# Patient Record
Sex: Female | Born: 1960 | Race: Black or African American | Hispanic: No | Marital: Married | Smoking: Former smoker
Health system: Southern US, Community
[De-identification: ages and names within clinical notes are randomized; demographics above are authoritative.]

## PROBLEM LIST (undated history)

## (undated) DIAGNOSIS — B1920 Unspecified viral hepatitis C without hepatic coma: Secondary | ICD-10-CM

## (undated) HISTORY — DX: Unspecified viral hepatitis C without hepatic coma: B19.20

## (undated) HISTORY — PX: BREAST SURGERY: SHX581

---

## 2004-10-31 HISTORY — PX: BREAST BIOPSY: SHX20

## 2005-05-10 ENCOUNTER — Ambulatory Visit: Payer: Self-pay | Admitting: Obstetrics and Gynecology

## 2005-06-02 ENCOUNTER — Ambulatory Visit: Payer: Self-pay | Admitting: Surgery

## 2009-08-06 ENCOUNTER — Ambulatory Visit: Payer: Self-pay | Admitting: Obstetrics and Gynecology

## 2010-09-14 ENCOUNTER — Ambulatory Visit: Payer: Self-pay | Admitting: Obstetrics and Gynecology

## 2012-03-08 ENCOUNTER — Ambulatory Visit: Payer: Self-pay | Admitting: Obstetrics and Gynecology

## 2012-06-02 ENCOUNTER — Emergency Department: Payer: Self-pay | Admitting: *Deleted

## 2013-08-26 ENCOUNTER — Encounter (INDEPENDENT_AMBULATORY_CARE_PROVIDER_SITE_OTHER): Payer: Self-pay

## 2013-08-26 ENCOUNTER — Ambulatory Visit (INDEPENDENT_AMBULATORY_CARE_PROVIDER_SITE_OTHER): Payer: 59 | Admitting: Adult Health

## 2013-08-26 ENCOUNTER — Encounter: Payer: Self-pay | Admitting: Adult Health

## 2013-08-26 VITALS — BP 120/70 | HR 75 | Temp 98.1°F | Resp 12 | Ht 62.25 in | Wt 141.0 lb

## 2013-08-26 DIAGNOSIS — Z8639 Personal history of other endocrine, nutritional and metabolic disease: Secondary | ICD-10-CM | POA: Insufficient documentation

## 2013-08-26 DIAGNOSIS — K5900 Constipation, unspecified: Secondary | ICD-10-CM

## 2013-08-26 NOTE — Assessment & Plan Note (Signed)
Increase fluid intake. Increase fiber. May try MiraLax over-the-counter laxatives as needed.

## 2013-08-26 NOTE — Patient Instructions (Signed)
   Thank you for choosing [facility] for your health care needs.  Please schedule your yearly physical for November 2014.  Please remember to activate your MyChart account. The activation code is located at the end of this form.

## 2013-08-26 NOTE — Assessment & Plan Note (Signed)
Previously given Drisdol for 12 weeks. Has not taken any OTC vit D since. Patient has physical exam due in November. We will follow up on her vit D as well as other labs.

## 2013-08-26 NOTE — Progress Notes (Signed)
  Subjective:    Patient ID: Kimberly Madden, female    DOB: 05/10/1961, 52 y.o.   MRN: [id number]  HPI  Patient is a pleasant 52 y/o female who presents to clinic to establish care. She was previously followed by Dr. Thornell Mule in [place]. Will request medical records. Patient is feeling well overall. She reports a history of vitamin D deficiency status post taking Drisdol and also some problems with constipation. She manages her constipation by increasing her vegetable and fruit intake.    Review of Systems  Constitutional: Negative.   HENT: Negative.   Eyes: Negative.        Recent eye exam 1 month ago  Respiratory: Negative.   Cardiovascular: Negative.   Gastrointestinal: Positive for constipation. Negative for abdominal pain and diarrhea.  Endocrine: Negative.   Genitourinary: Negative.   Musculoskeletal: Negative.   Skin: Negative.   Allergic/Immunologic: Negative.   Neurological: Negative.   Psychiatric/Behavioral: Negative.        Objective:   Physical Exam  Constitutional: She is oriented to person, place, and time. She appears well-developed and well-nourished. No distress.  HENT:  Head: Normocephalic and atraumatic.  Right Ear: External ear normal.  Left Ear: External ear normal.  Nose: Nose normal.  Mouth/Throat: Oropharynx is clear and moist.  Eyes: Conjunctivae and EOM are normal. Pupils are equal, round, and reactive to light.  Neck: Normal range of motion. Neck supple.  Cardiovascular: Normal rate, regular rhythm and normal heart sounds.  Exam reveals no gallop and no friction rub.   No murmur heard. Pulmonary/Chest: Effort normal and breath sounds normal.  Abdominal: Soft. Bowel sounds are normal.  Musculoskeletal: Normal range of motion.  Neurological: She is alert and oriented to person, place, and time.  Skin: Skin is warm and dry.  Psychiatric: She has a normal mood and affect. Her behavior is normal. Judgment and thought content normal.    BP  120/70  Pulse 75  Temp(Src) 98.1 F (36.7 C) (Oral)  Resp 12  Ht 5' 2.25" (1.581 m)  Wt 141 lb (63.957 kg)  BMI 25.59 kg/m2  SpO2 98%       Assessment & Plan:

## 2014-06-05 ENCOUNTER — Encounter: Payer: 59 | Admitting: Adult Health

## 2014-06-12 ENCOUNTER — Ambulatory Visit (INDEPENDENT_AMBULATORY_CARE_PROVIDER_SITE_OTHER): Payer: 59 | Admitting: Adult Health

## 2014-06-12 ENCOUNTER — Encounter: Payer: Self-pay | Admitting: Adult Health

## 2014-06-12 VITALS — BP 133/79 | HR 66 | Temp 98.2°F | Resp 14 | Ht 62.25 in | Wt 146.2 lb

## 2014-06-12 DIAGNOSIS — Z1239 Encounter for other screening for malignant neoplasm of breast: Secondary | ICD-10-CM

## 2014-06-12 DIAGNOSIS — Z0000 Encounter for general adult medical examination without abnormal findings: Secondary | ICD-10-CM

## 2014-06-12 DIAGNOSIS — Z1211 Encounter for screening for malignant neoplasm of colon: Secondary | ICD-10-CM

## 2014-06-12 DIAGNOSIS — Z23 Encounter for immunization: Secondary | ICD-10-CM

## 2014-06-12 NOTE — Progress Notes (Signed)
Patient ID: Kimberly Madden, female   DOB: November 13, 1960, 53 y.o.   MRN: [id number]   Subjective:    Patient ID: Kimberly Madden, female    DOB: November 13, 1960, 53 y.o.   MRN: [id number]  HPI Pt is a pleasant 53 y/o female who presents for her annual physical exam and Health Maintenance update. She is feeling well. No concerns this visit.  No past medical history on file.   Past Surgical History  Procedure Laterality Date  . Breast surgery Left     benign biopsy     Family History  Problem Relation Age of Onset  . Cancer Paternal Aunt 650    breast cancer - died  . Cancer Paternal Aunt 4060    lung cancer - smoker - died  . Diabetes Paternal Aunt      History   Social History  . Marital Status: Married    Spouse Name: Smitty CordsBruce    Number of Children: 1  . Years of Education: 12   Occupational History  . Customer Service [employer]   Social History Main Topics  . Smoking status: Former Smoker -- 3 years    Types: Cigarettes    Quit date: 08/01/1983  . Smokeless tobacco: Never Used  . Alcohol Use: Yes  . Drug Use: No  . Sexual Activity: Not on file   Other Topics Concern  . Not on file   Social History Narrative   Kimberly Madden grew up in [place], [place]. She lives at home with her husband, Smitty CordsBruce, of 20 years. Kimberly Madden has a daughter, Drema PryShayla, who lives in [place]. Kimberly Madden and her husband have a dog named Will. She enjoys traveling and gardening. She has a vegetable garden that she tends to. She also enjoys spending time with friends and family. 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        Review of Systems  Constitutional: Negative.   HENT: Negative.   Eyes: Negative.   Respiratory: Negative.   Cardiovascular: Negative.   Gastrointestinal: Negative.   Endocrine: Negative.   Genitourinary: Negative.   Musculoskeletal: Negative.   Skin: Negative.   Allergic/Immunologic: Negative.   Neurological: Negative.   Hematological: Negative.     Psychiatric/Behavioral: Negative.        Objective:  BP 133/79  Pulse 66  Temp(Src) 98.2 F (36.8 C) (Oral)  Resp 14  Ht 5' 2.25" (1.581 m)  Wt 146 lb 4 oz (66.339 kg)  BMI 26.54 kg/m2  SpO2 100%   Physical Exam  Constitutional: She is oriented to person, place, and time. She appears well-developed and well-nourished. No distress.  HENT:  Head: Normocephalic and atraumatic.  Right Ear: External ear normal.  Left Ear: External ear normal.  Nose: Nose normal.  Mouth/Throat: Oropharynx is clear and moist.  Eyes: Conjunctivae and EOM are normal. Pupils are equal, round, and reactive to light.  Neck: Normal range of motion. Neck supple. No tracheal deviation present. No thyromegaly present.  Cardiovascular: Normal rate, regular rhythm, normal heart sounds and intact distal pulses.  Exam reveals no gallop and no friction rub.   No murmur heard. Pulmonary/Chest: Effort normal and breath sounds normal. No respiratory distress. She has no wheezes. She has no rales.  Abdominal: Soft. Bowel sounds are normal. She exhibits no distension and no mass. There is no tenderness. There is no rebound and no guarding.  Musculoskeletal: Normal range of motion. She exhibits no edema and no tenderness.  Lymphadenopathy:    She has no cervical adenopathy.  Neurological: She is alert and oriented to person, place, and time. She has normal reflexes. No cranial nerve deficit. Coordination normal.  Skin: Skin is warm and dry.  Psychiatric: She has a normal mood and affect. Her behavior is normal. Judgment and thought content normal.       Assessment & Plan:   1. Routine general medical examination at a health care facility Normal physical exam. Breast exam was normal. Screenings addressed separately  2. Screening for breast cancer Mammogram will be scheduled prior to leaving the office today - MM DIGITAL SCREENING BILATERAL; Future  3. Screen for colon cancer Refer to [facility] GI for her screening  colonoscopy - Ambulatory referral to Gastroenterology  4. Need for Tdap vaccination Given in clinic today.

## 2014-06-12 NOTE — Patient Instructions (Signed)
  You had your annual physical exam today.  Please have your labs drawn at [facility] at your earliest convenience. You will need to be fasting. Please drink water  I am referring you to a screening colonoscopy  Please have your mammogram scheduled prior to leaving the office.  You received your tetanus vaccine today. This is good for 10 years.

## 2014-06-12 NOTE — Progress Notes (Signed)
Pre visit review using our clinic review tool, if applicable. No additional management support is needed unless otherwise documented below in the visit note. 

## 2014-06-13 ENCOUNTER — Encounter: Payer: Self-pay | Admitting: Internal Medicine

## 2014-07-11 LAB — LIPID PANEL
Cholesterol: 175 mg/dL (ref 0–200)
HDL: 71 mg/dL — AB (ref 35–70)
LDL CALC: 87 mg/dL
TRIGLYCERIDES: 87 mg/dL (ref 40–160)

## 2014-07-11 LAB — BASIC METABOLIC PANEL
BUN: 12 mg/dL (ref 4–21)
Creatinine: 0.8 mg/dL (ref 0.5–1.1)
Glucose: 92 mg/dL
POTASSIUM: 4.5 mmol/L (ref 3.4–5.3)
Sodium: 140 mmol/L (ref 137–147)

## 2014-07-11 LAB — CBC AND DIFFERENTIAL
HEMATOCRIT: 38 % (ref 36–46)
HEMOGLOBIN: 13.2 g/dL (ref 12.0–16.0)
NEUTROS ABS: 2 /uL
Platelets: 208 10*3/uL (ref 150–399)
WBC: 5.5 10^3/mL

## 2014-07-11 LAB — HEPATIC FUNCTION PANEL
ALT: 26 U/L (ref 7–35)
AST: 43 U/L — AB (ref 13–35)
Alkaline Phosphatase: 55 U/L (ref 25–125)
Bilirubin, Total: 0.5 mg/dL

## 2014-07-11 LAB — TSH: TSH: 3.31 u[IU]/mL (ref 0.41–5.90)

## 2014-07-17 ENCOUNTER — Telehealth: Payer: Self-pay | Admitting: Adult Health

## 2014-07-17 ENCOUNTER — Other Ambulatory Visit: Payer: Self-pay | Admitting: Adult Health

## 2014-07-17 DIAGNOSIS — R768 Other specified abnormal immunological findings in serum: Secondary | ICD-10-CM

## 2014-07-17 NOTE — Telephone Encounter (Signed)
Labs show the following:  Normal WBC, RBC, Hgb, Platelets  Normal electrolytes, kidney function, glucose  Slightly elevated AST (liver enzyme) - need to recheck this in 3-4 weeks  Cholesterol is good  B12 is low normal - recommend taking 1000 mcg orally daily  Vitamin D is low - needs to take D3 - 2000 units daily  Antibodies for Hepatitis C was detected - this means she has had past hepatitis C infection or current infection. I am referring her to GI.

## 2014-07-18 NOTE — Telephone Encounter (Signed)
Called patient, no answer. Unable to leave voicemail. Letter was mailed with Raquel comments and lab results.

## 2014-07-24 ENCOUNTER — Telehealth: Payer: Self-pay | Admitting: Internal Medicine

## 2014-07-24 NOTE — Telephone Encounter (Signed)
Recent labs showed normal blood counts, normal kidney function. Slight elevation of liver function tests noted. Cholesterol was normal. B12 was low normal. TSH was normal. Testing for Hepatitis C was positive.

## 2014-08-06 ENCOUNTER — Ambulatory Visit (AMBULATORY_SURGERY_CENTER): Payer: Self-pay

## 2014-08-06 VITALS — Ht 64.0 in | Wt 145.0 lb

## 2014-08-06 DIAGNOSIS — Z1211 Encounter for screening for malignant neoplasm of colon: Secondary | ICD-10-CM

## 2014-08-06 MED ORDER — MOVIPREP 100 G PO SOLR: 1.0000 | Freq: Once | ORAL | Status: DC

## 2014-08-06 NOTE — Progress Notes (Signed)
No allergies to eggs or soy No past problems with anesthesia No home oxygen No diet/weight loss meds  Has emai  Emmi instructions given for colonoscopy 

## 2014-08-20 ENCOUNTER — Ambulatory Visit (AMBULATORY_SURGERY_CENTER): Payer: 59 | Admitting: Internal Medicine

## 2014-08-20 ENCOUNTER — Encounter: Payer: Self-pay | Admitting: Internal Medicine

## 2014-08-20 VITALS — BP 141/79 | HR 58 | Temp 96.4°F | Resp 21 | Ht 64.0 in | Wt 145.0 lb

## 2014-08-20 DIAGNOSIS — Z1211 Encounter for screening for malignant neoplasm of colon: Secondary | ICD-10-CM

## 2014-08-20 MED ORDER — SODIUM CHLORIDE 0.9 % IV SOLN: 500.0000 mL | INTRAVENOUS | Status: DC

## 2014-08-20 NOTE — Progress Notes (Signed)
Stable to RR 

## 2014-08-20 NOTE — Op Note (Signed)
[facility] [address] [place], [id number]   COLONOSCOPY PROCEDURE REPORT  PATIENT: Kimberly Madden, [name]  MR#: [id number] BIRTHDATE: 11/08/1960 , 53  yrs. old GENDER: female ENDOSCOPIST: Beverley FiedlerJay M Heru Montz, MD REFERRED BY: Dale Durhamharlene Scott, MD PROCEDURE DATE:  08/20/2014 PROCEDURE:   Colonoscopy, screening First Screening Colonoscopy - Avg.  risk and is 50 yrs.  old or older Yes.  Prior Negative Screening - Now for repeat screening. N/A  History of Adenoma - Now for follow-up colonoscopy & has been > or = to 3 yrs.  N/A  Polyps Removed Today? No.  Polyps Removed Today? No.  Recommend repeat exam, <10 yrs? Polyps Removed Today? No.  Recommend repeat exam, <10 yrs? No. ASA CLASS:   Class I INDICATIONS:average risk for colorectal cancer and first colonoscopy. MEDICATIONS: Monitored anesthesia care and Propofol 200 mg IV; lidocaine 40 mg IV  DESCRIPTION OF PROCEDURE:   After the risks benefits and alternatives of the procedure were thoroughly explained, informed consent was obtained.  The digital rectal exam revealed no abnormalities of the rectum.   The LB PFC-H190 [id number]  endoscope was introduced through the anus and advanced to the cecum, which was identified by both the appendix and ileocecal valve. No adverse events experienced.   The quality of the prep was good, using MoviPrep  The instrument was then slowly withdrawn as the colon was fully examined.      COLON FINDINGS: A normal appearing cecum, ileocecal valve, and appendiceal orifice were identified.  The ascending, transverse, descending, sigmoid colon, and rectum appeared unremarkable. Retroflexed views revealed no abnormalities. The time to cecum=1 minutes 10 seconds.  Withdrawal time=6 minutes 34 seconds.  The scope was withdrawn and the procedure completed.  COMPLICATIONS: There were no immediate complications.  ENDOSCOPIC IMPRESSION: Normal colonoscopy  RECOMMENDATIONS: You should continue to  follow colorectal cancer screening guidelines for "routine risk" patients with a repeat colonoscopy in 10 years. There is no need for FOBT (stool) testing for at least 5 years.  eSigned:  Beverley FiedlerJay M Senya Hinzman, MD 08/20/2014 9:43 AM   cc:  The Patient, Dale Durhamharlene Scott, MD

## 2014-08-20 NOTE — Patient Instructions (Signed)
YOU HAD AN ENDOSCOPIC PROCEDURE TODAY AT THE [facility]: Refer to the procedure report that was given to you for any specific questions about what was found during the examination.  If the procedure report does not answer your questions, please call your gastroenterologist to clarify.  If you requested that your care partner not be given the details of your procedure findings, then the procedure report has been included in a sealed envelope for you to review at your convenience later.  YOU SHOULD EXPECT: Some feelings of bloating in the abdomen. Passage of more gas than usual.  Walking can help get rid of the air that was put into your GI tract during the procedure and reduce the bloating. If you had a lower endoscopy (such as a colonoscopy or flexible sigmoidoscopy) you may notice spotting of blood in your stool or on the toilet paper. If you underwent a bowel prep for your procedure, then you may not have a normal bowel movement for a few days.  DIET: Your first meal following the procedure should be a light meal and then it is ok to progress to your normal diet.  A half-sandwich or bowl of soup is an example of a good first meal.  Heavy or fried foods are harder to digest and may make you feel nauseous or bloated.  Likewise meals heavy in dairy and vegetables can cause extra gas to form and this can also increase the bloating.  Drink plenty of fluids but you should avoid alcoholic beverages for 24 hours.  ACTIVITY: Your care partner should take you home directly after the procedure.  You should plan to take it easy, moving slowly for the rest of the day.  You can resume normal activity the day after the procedure however you should NOT DRIVE or use heavy machinery for 24 hours (because of the sedation medicines used during the test).    SYMPTOMS TO REPORT IMMEDIATELY: A gastroenterologist can be reached at any hour.  During normal business hours, 8:30 AM to 5:00 PM Monday through Friday,  call [phone].  After hours and on weekends, please call the GI answering service at [phone] who will take a message and have the physician on call contact you.   Following lower endoscopy (colonoscopy or flexible sigmoidoscopy):  Excessive amounts of blood in the stool  Significant tenderness or worsening of abdominal pains  Swelling of the abdomen that is new, acute  Fever of 100F or higher    FOLLOW UP: If any biopsies were taken you will be contacted by phone or by letter within the next 1-3 weeks.  Call your gastroenterologist if you have not heard about the biopsies in 3 weeks.  Our staff will call the home number listed on your records the next business day following your procedure to check on you and address any questions or concerns that you may have at that time regarding the information given to you following your procedure. This is a courtesy call and so if there is no answer at the home number and we have not heard from you through the emergency physician on call, we will assume that you have returned to your regular daily activities without incident.  SIGNATURES/CONFIDENTIALITY: You and/or your care partner have signed paperwork which will be entered into your electronic medical record.  These signatures attest to the fact that that the information above on your After Visit Summary has been reviewed and is understood.  Full responsibility of the confidentiality   of this discharge information lies with you and/or your care-partner.     

## 2014-08-21 ENCOUNTER — Telehealth: Payer: Self-pay

## 2014-08-21 NOTE — Telephone Encounter (Signed)
Left a message at [phone] for the pt to call if any questions or concerns. maw

## 2014-11-12 ENCOUNTER — Ambulatory Visit: Payer: Self-pay | Admitting: Gastroenterology

## 2015-06-15 ENCOUNTER — Ambulatory Visit: Payer: 59 | Admitting: Internal Medicine

## 2015-06-15 ENCOUNTER — Other Ambulatory Visit (HOSPITAL_COMMUNITY)
Admission: RE | Admit: 2015-06-15 | Discharge: 2015-06-15 | Disposition: A | Discharge status: Home / Self Care | Payer: 59 | Source: Ambulatory Visit | Attending: Family Medicine | Admitting: Family Medicine

## 2015-06-15 ENCOUNTER — Ambulatory Visit (INDEPENDENT_AMBULATORY_CARE_PROVIDER_SITE_OTHER): Payer: 59 | Admitting: Family Medicine

## 2015-06-15 ENCOUNTER — Encounter: Payer: Self-pay | Admitting: Family Medicine

## 2015-06-15 VITALS — BP 130/82 | HR 67 | Temp 98.9°F | Ht 62.25 in | Wt 148.0 lb

## 2015-06-15 DIAGNOSIS — Z1239 Encounter for other screening for malignant neoplasm of breast: Secondary | ICD-10-CM

## 2015-06-15 DIAGNOSIS — Z124 Encounter for screening for malignant neoplasm of cervix: Secondary | ICD-10-CM

## 2015-06-15 DIAGNOSIS — B182 Chronic viral hepatitis C: Secondary | ICD-10-CM | POA: Diagnosis not present

## 2015-06-15 DIAGNOSIS — Z1151 Encounter for screening for human papillomavirus (HPV): Secondary | ICD-10-CM | POA: Insufficient documentation

## 2015-06-15 DIAGNOSIS — Z01419 Encounter for gynecological examination (general) (routine) without abnormal findings: Secondary | ICD-10-CM | POA: Insufficient documentation

## 2015-06-15 DIAGNOSIS — Z0000 Encounter for general adult medical examination without abnormal findings: Secondary | ICD-10-CM | POA: Diagnosis not present

## 2015-06-15 DIAGNOSIS — B1920 Unspecified viral hepatitis C without hepatic coma: Secondary | ICD-10-CM | POA: Insufficient documentation

## 2015-06-15 NOTE — Patient Instructions (Signed)
It was nice to see you today.  Please fax [place] your lab results.   Follow up annually or sooner if needed.  Take care  Dr. Adriana Simas

## 2015-06-15 NOTE — Progress Notes (Signed)
Pre visit review using our clinic review tool, if applicable. No additional management support is needed unless otherwise documented below in the visit note. 

## 2015-06-15 NOTE — Assessment & Plan Note (Signed)
Pap smear performed today. Gynecological and breast exams normal. Order placed for mammogram. Remainder of preventative healthcare up to date. Patient to follow-up annually.

## 2015-06-15 NOTE — Progress Notes (Signed)
Subjective:  Patient ID: Kimberly Madden, female    DOB: 04/03/1961  Age: 54 y.o. MRN: [id number]  CC: Annual physical exam with pap smear.   HPI 54 year old female presents to clinic today for an annual visit. Patient has no concerns today. Complete review of systems was done (see below).  Preventative health care Preventative Healthcare  Pap smear: In need of. Last pap smear was ~ 3 years ago.  Mammogram/Breast exam: Last breast exam and mammogram was ~ 3 years ago.  Immunizations: UTD.  Labs: Obtained recently at [facility]. Asked patient to fax to our office for our records.  Smoking/tobacco use: Former smoker.   HIV testing: Patient declined HIV testing.  PMH, Surgical Hx, Family Hx, Social History reviewed and updated as below.  Past Medical History  Diagnosis Date  . Hepatitis C    Past Surgical History  Procedure Laterality Date  . Breast surgery Left     benign biopsy    Family History  Problem Relation Age of Onset  . Cancer Paternal Aunt 56    breast cancer - died  . Cancer Paternal Aunt 22    lung cancer - smoker - died  . Diabetes Paternal Aunt   . Colon cancer Neg Hx   . Stomach cancer Neg Hx     Social History  Substance Use Topics  . Smoking status: Former Smoker -- 3 years    Types: Cigarettes    Quit date: 08/01/1983  . Smokeless tobacco: Never Used  . Alcohol Use: Yes     Review of Systems  Constitutional: Negative for fever and chills.  HENT: Negative for congestion and sore throat.   Eyes: Negative for pain and visual disturbance.  Respiratory: Negative for cough and shortness of breath.   Cardiovascular: Negative for chest pain.  Gastrointestinal: Negative for nausea, vomiting, abdominal pain, diarrhea and constipation.  Genitourinary: Negative for dysuria, urgency and frequency.  Musculoskeletal: Negative for arthralgias.  Skin: Negative for rash.  Neurological: Negative for dizziness and light-headedness.    Objective:  BP  130/82 mmHg  Pulse 67  Temp(Src) 98.9 F (37.2 C) (Oral)  Ht 5' 2.25" (1.581 m)  Wt 148 lb (67.132 kg)  BMI 26.86 kg/m2  SpO2 98%  LMP 11/06/2012  BP/Weight 06/15/2015 08/20/2014 08/06/2014  Systolic BP 130 141 -  Diastolic BP 82 79 -  Wt. (Lbs) 148 145 145  BMI 26.86 24.88 24.88   Physical Exam  Constitutional: She is oriented to person, place, and time. She appears well-developed and well-nourished. No distress.  HENT:  Head: Normocephalic and atraumatic.  Nose: Nose normal.  Mouth/Throat: Oropharynx is clear and moist. No oropharyngeal exudate.  Normal TM's bilaterally.   Eyes: Conjunctivae are normal. No scleral icterus.  Neck: Neck supple.  Cardiovascular: Normal rate and regular rhythm.   No murmur heard. Pulmonary/Chest: Effort normal and breath sounds normal. She has no wheezes. She has no rales.  Breasts: breasts appear normal, no appreciable masses, no skin or nipple changes or axillary nodes.   Abdominal: Soft. She exhibits no distension. There is no tenderness. There is no rebound and no guarding.  Genitourinary:  Pelvic Exam:        External: normal female genitalia without lesions or           masses        Vagina: normal without lesions or masses        Cervix: normal without lesions or masses        Pap smear:  performed    Lymphadenopathy:    She has no cervical adenopathy.  Neurological: She is alert and oriented to person, place, and time.  Skin: Skin is warm and dry. No rash noted.  Psychiatric: She has a normal mood and affect.  Vitals reviewed.   Assessment & Plan:   Problem List Items Addressed This Visit    Hepatitis C without hepatic coma   Preventative health care    Pap smear performed today. Gynecological and breast exams normal. Order placed for mammogram. Remainder of preventative healthcare up to date. Patient to follow-up annually.       Other Visit Diagnoses    Well woman exam with routine gynecological exam    -  Primary     Relevant Orders    Cytology - PAP    Breast cancer screening        Relevant Orders    MM DIGITAL SCREENING BILATERAL       Meds ordered this encounter  Medications  . ibuprofen (ADVIL,MOTRIN) 200 MG tablet    Sig: Take 200 mg by mouth as needed.    Follow-up: Return in about 1 year (around 06/14/2016).    Everlene Other, DO

## 2015-06-16 ENCOUNTER — Telehealth: Payer: Self-pay

## 2015-06-16 NOTE — Telephone Encounter (Signed)
Called patient to schedule her mammogram appt. i left a voicemail  To call me back.

## 2015-06-17 LAB — CYTOLOGY - PAP

## 2015-07-16 ENCOUNTER — Encounter: Payer: Self-pay | Admitting: Internal Medicine

## 2015-07-24 ENCOUNTER — Ambulatory Visit: Payer: 59

## 2015-07-24 ENCOUNTER — Ambulatory Visit
Admission: RE | Admit: 2015-07-24 | Discharge: 2015-07-24 | Disposition: A | Discharge status: Home / Self Care | Payer: 59 | Source: Ambulatory Visit | Attending: Family Medicine | Admitting: Family Medicine

## 2015-07-24 DIAGNOSIS — Z1231 Encounter for screening mammogram for malignant neoplasm of breast: Secondary | ICD-10-CM | POA: Diagnosis present

## 2015-07-24 DIAGNOSIS — Z1239 Encounter for other screening for malignant neoplasm of breast: Secondary | ICD-10-CM

## 2016-06-16 ENCOUNTER — Encounter: Payer: Self-pay | Admitting: Internal Medicine

## 2016-06-16 ENCOUNTER — Ambulatory Visit (INDEPENDENT_AMBULATORY_CARE_PROVIDER_SITE_OTHER): Payer: 59 | Admitting: Internal Medicine

## 2016-06-16 ENCOUNTER — Other Ambulatory Visit: Payer: Self-pay | Admitting: Internal Medicine

## 2016-06-16 ENCOUNTER — Encounter (INDEPENDENT_AMBULATORY_CARE_PROVIDER_SITE_OTHER): Payer: Self-pay

## 2016-06-16 VITALS — BP 160/82 | HR 71 | Ht 62.0 in | Wt 154.0 lb

## 2016-06-16 DIAGNOSIS — R030 Elevated blood-pressure reading, without diagnosis of hypertension: Secondary | ICD-10-CM | POA: Diagnosis not present

## 2016-06-16 DIAGNOSIS — B182 Chronic viral hepatitis C: Secondary | ICD-10-CM

## 2016-06-16 DIAGNOSIS — IMO0001 Reserved for inherently not codable concepts without codable children: Secondary | ICD-10-CM

## 2016-06-16 DIAGNOSIS — Z8639 Personal history of other endocrine, nutritional and metabolic disease: Secondary | ICD-10-CM

## 2016-06-16 DIAGNOSIS — Z0000 Encounter for general adult medical examination without abnormal findings: Secondary | ICD-10-CM

## 2016-06-16 NOTE — Progress Notes (Signed)
Patient ID: Carolyn Sylvia, female   DOB: 01-12-1961, 55 y.o.   MRN: [id number]   Subjective:    Patient ID: Milanna Kozlov, female    DOB: 1961/07/13, 55 y.o.   MRN: [id number]  HPI  Patient here for a physical exam.  She was previously seen by Raquel Rey.  Had physical last year with Dr Adriana Simas.  See notes.  Here today for a physical.  Was seeing GI for hepatitis C.  S/p Harvoni - 12/30/14.  States doing well.  Feels good.  Stays active.  No cardiac symptoms with increased activity or exertion.  Breathing stable.  No abdominal pain or cramping.  Bowels stable.  Elevated blood pressure today.     Past Medical History:  Diagnosis Date  . Hepatitis C    Past Surgical History:  Procedure Laterality Date  . BREAST BIOPSY Right 2006   neg  . BREAST SURGERY Left    benign biopsy   Family History  Problem Relation Age of Onset  . Cancer Paternal Aunt 52    breast cancer - died  . Cancer Paternal Aunt 24    lung cancer - smoker - died  . Diabetes Paternal Aunt   . Colon cancer Neg Hx   . Stomach cancer Neg Hx    Social History   Social History  . Marital status: Single    Spouse name: Bruce  . Number of children: 1  . Years of education: 73   Occupational History  . Customer Service [employer]   Social History Main Topics  . Smoking status: Former Smoker    Years: 3.00    Types: Cigarettes    Quit date: 08/01/1983  . Smokeless tobacco: Never Used  . Alcohol use Yes  . Drug use: No  . Sexual activity: Not Asked   Other Topics Concern  . None   Social History Narrative   Ellason grew up in [place], [place]. She lives at home with her husband, Smitty Cords, of 20 years. Sherise has a daughter, Drema Pry, who lives in [place]. Jackline and her husband have a dog named Will. She enjoys traveling and gardening. She has a vegetable garden that she tends to. She also enjoys spending time with friends and family. 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    Outpatient  Encounter Prescriptions as of 06/16/2016  Medication Sig  . ibuprofen (ADVIL,MOTRIN) 200 MG tablet Take 200 mg by mouth as needed.   No facility-administered encounter medications on file as of 06/16/2016.     Review of Systems  Constitutional: Negative for appetite change and unexpected weight change.  HENT: Negative for congestion and sinus pressure.   Eyes: Negative for pain and visual disturbance.  Respiratory: Negative for cough, chest tightness and shortness of breath.   Cardiovascular: Negative for chest pain, palpitations and leg swelling.  Gastrointestinal: Negative for abdominal pain, diarrhea, nausea and vomiting.  Genitourinary: Negative for difficulty urinating and dysuria.  Musculoskeletal: Negative for back pain and joint swelling.  Skin: Negative for color change and rash.  Neurological: Negative for dizziness, light-headedness and headaches.  Hematological: Negative for adenopathy. Does not bruise/bleed easily.  Psychiatric/Behavioral: Negative for agitation and dysphoric mood.       Objective:    Physical Exam  Constitutional: She is oriented to person, place, and time. She appears well-developed and well-nourished. No distress.  HENT:  Nose: Nose normal.  Mouth/Throat: Oropharynx is clear and moist.  Eyes: Right eye exhibits no discharge. Left eye exhibits no  discharge. No scleral icterus.  Neck: Neck supple. No thyromegaly present.  Cardiovascular: Normal rate and regular rhythm.   Pulmonary/Chest: Breath sounds normal. No accessory muscle usage. No tachypnea. No respiratory distress. She has no decreased breath sounds. She has no wheezes. She has no rhonchi. Right breast exhibits no inverted nipple, no mass, no nipple discharge and no tenderness (no axillary adenopathy). Left breast exhibits no inverted nipple, no mass, no nipple discharge and no tenderness (no axilarry adenopathy).  Abdominal: Soft. Bowel sounds are normal. There is no tenderness.    Genitourinary:  Genitourinary Comments: Normal external genitalia.  Vaginal vault without lesions.  Cervix identified.  Pap smear performed.  Could not appreciate any adnexal masses or tenderness.    Musculoskeletal: She exhibits no edema or tenderness.  Lymphadenopathy:    She has no cervical adenopathy.  Neurological: She is alert and oriented to person, place, and time.  Skin: Skin is warm. No rash noted. No erythema.  Psychiatric: She has a normal mood and affect. Her behavior is normal.    BP (!) 160/82   Pulse 71   Ht 5\' 2"  (1.575 m)   Wt 154 lb (69.9 kg)   LMP 11/06/2012   SpO2 99%   BMI 28.17 kg/m  Wt Readings from Last 3 Encounters:  06/16/16 154 lb (69.9 kg)  06/15/15 148 lb (67.1 kg)  08/20/14 145 lb (65.8 kg)     Lab Results  Component Value Date   WBC 5.5 07/11/2014   HGB 13.2 07/11/2014   HCT 38 07/11/2014   PLT 208 07/11/2014   CHOL 175 07/11/2014   TRIG 87 07/11/2014   HDL 71 (A) 07/11/2014   LDLCALC 87 07/11/2014   ALT 26 07/11/2014   AST 43 (A) 07/11/2014   NA 140 07/11/2014   K 4.5 07/11/2014   CREATININE 0.8 07/11/2014   BUN 12 07/11/2014   TSH 3.31 07/11/2014    Mm Digital Screening Bilateral  Result Date: 07/24/2015 CLINICAL DATA:  Screening. EXAM: DIGITAL SCREENING BILATERAL MAMMOGRAM WITH CAD COMPARISON:  Previous exam(s). ACR Breast Density Category c: The breast tissue is heterogeneously dense, which may obscure small masses. FINDINGS: There are no findings suspicious for malignancy. Images were processed with CAD. IMPRESSION: No mammographic evidence of malignancy. A result letter of this screening mammogram will be mailed directly to the patient. RECOMMENDATION: Screening mammogram in one year. (Code:SM-B-01Y) BI-RADS CATEGORY  1: Negative. Electronically Signed   By: Frederico HammanMichelle  Collins M.D.   On: 07/24/2015 14:13       Assessment & Plan:   Problem List Items Addressed This Visit    Elevated blood pressure    Does not have diagnosis  of hypertension.  Blood pressure elevated today.  Improved on recheck prior to leaving - 150s systolic.  Have her spot check her pressure.  Get her back in soon to reassess.  Check metabolic panel.        Healthcare maintenance    Physical 06/16/16.  PAP 06/16/16.   Colonoscopy 2015.  Colonoscopy 07/24/15 - Birads I.       Hepatitis C without hepatic coma    S/p treatment with Harvoni.  Has been followed by GI.  Will need to arrange f/u if not scheduled.        Relevant Orders   CBC with Differential/Platelet   TSH   Lipid panel   Hepatic function panel   Basic metabolic panel   History of vitamin D deficiency    Will recheck vitamin D  level with next labs.        Relevant Orders   VITAMIN D 25 Hydroxy (Vit-D Deficiency, Fractures)    Other Visit Diagnoses    Annual physical exam    -  Primary   Relevant Orders   Cytology - PAP       Dale DurhamSCOTT, Lavontay Kirk, MD

## 2016-06-24 LAB — PAP LB AND HPV HIGH-RISK
HPV, high-risk: NEGATIVE
PAP SMEAR COMMENT: 0

## 2016-06-24 LAB — SPECIMEN STATUS REPORT

## 2016-06-26 ENCOUNTER — Encounter: Payer: Self-pay | Admitting: Internal Medicine

## 2016-06-26 DIAGNOSIS — I10 Essential (primary) hypertension: Secondary | ICD-10-CM | POA: Insufficient documentation

## 2016-06-26 NOTE — Assessment & Plan Note (Signed)
Will recheck vitamin D level with next labs.

## 2016-06-26 NOTE — Assessment & Plan Note (Signed)
Physical 06/16/16.  PAP 06/16/16.   Colonoscopy 2015.  Colonoscopy 07/24/15 - Birads I.

## 2016-06-26 NOTE — Assessment & Plan Note (Signed)
Does not have diagnosis of hypertension.  Blood pressure elevated today.  Improved on recheck prior to leaving - 150s systolic.  Have her spot check her pressure.  Get her back in soon to reassess.  Check metabolic panel.

## 2016-06-26 NOTE — Assessment & Plan Note (Signed)
S/p treatment with Harvoni.  Has been followed by GI.  Will need to arrange f/u if not scheduled.

## 2016-07-07 ENCOUNTER — Telehealth: Payer: Self-pay | Admitting: Internal Medicine

## 2016-07-07 NOTE — Telephone Encounter (Signed)
Lm on vm for pt to call back about Bp Check for today.. Need to schedule appt with Dr.Scott

## 2016-07-12 ENCOUNTER — Telehealth: Payer: Self-pay | Admitting: *Deleted

## 2016-07-12 NOTE — Telephone Encounter (Signed)
Pt is now back at work next break is at 2:45pm. Thank you!

## 2016-07-12 NOTE — Telephone Encounter (Signed)
Patient as requested lab results, please call pt at 0930,this will be her break time.  Pt contact [phone]

## 2016-07-27 ENCOUNTER — Telehealth: Payer: Self-pay | Admitting: Internal Medicine

## 2016-07-27 ENCOUNTER — Other Ambulatory Visit: Payer: Self-pay

## 2016-07-27 ENCOUNTER — Encounter: Payer: Self-pay | Admitting: *Deleted

## 2016-07-27 DIAGNOSIS — Z1239 Encounter for other screening for malignant neoplasm of breast: Secondary | ICD-10-CM

## 2016-07-27 NOTE — Telephone Encounter (Signed)
Pt called wanting to get her mammo done. Need order please and thank you. Pt goes to [facility]. Thank you!

## 2016-07-27 NOTE — Telephone Encounter (Signed)
Left message to notify patient order for mammo is in

## 2016-07-27 NOTE — Progress Notes (Signed)
Order in as requested by patient

## 2016-08-04 NOTE — Telephone Encounter (Signed)
Unread mychart message mailed to patient 

## 2016-08-25 ENCOUNTER — Ambulatory Visit
Admission: RE | Admit: 2016-08-25 | Discharge: 2016-08-25 | Disposition: A | Discharge status: Home / Self Care | Payer: 59 | Source: Ambulatory Visit | Attending: Internal Medicine | Admitting: Internal Medicine

## 2016-08-25 DIAGNOSIS — Z1239 Encounter for other screening for malignant neoplasm of breast: Secondary | ICD-10-CM

## 2016-08-25 DIAGNOSIS — Z1231 Encounter for screening mammogram for malignant neoplasm of breast: Secondary | ICD-10-CM | POA: Diagnosis not present

## 2017-07-28 ENCOUNTER — Other Ambulatory Visit: Payer: Self-pay | Admitting: Internal Medicine

## 2017-07-28 ENCOUNTER — Encounter: Payer: Self-pay | Admitting: Internal Medicine

## 2017-07-28 ENCOUNTER — Ambulatory Visit (INDEPENDENT_AMBULATORY_CARE_PROVIDER_SITE_OTHER): Payer: 59 | Admitting: Internal Medicine

## 2017-07-28 VITALS — BP 158/80 | HR 67 | Temp 98.6°F | Resp 14 | Ht 62.0 in | Wt 161.8 lb

## 2017-07-28 DIAGNOSIS — Z0001 Encounter for general adult medical examination with abnormal findings: Secondary | ICD-10-CM | POA: Diagnosis not present

## 2017-07-28 DIAGNOSIS — Z23 Encounter for immunization: Secondary | ICD-10-CM | POA: Diagnosis not present

## 2017-07-28 DIAGNOSIS — I10 Essential (primary) hypertension: Secondary | ICD-10-CM | POA: Diagnosis not present

## 2017-07-28 DIAGNOSIS — Z0000 Encounter for general adult medical examination without abnormal findings: Secondary | ICD-10-CM

## 2017-07-28 DIAGNOSIS — Z124 Encounter for screening for malignant neoplasm of cervix: Secondary | ICD-10-CM

## 2017-07-28 DIAGNOSIS — Z8639 Personal history of other endocrine, nutritional and metabolic disease: Secondary | ICD-10-CM

## 2017-07-28 DIAGNOSIS — Z1231 Encounter for screening mammogram for malignant neoplasm of breast: Secondary | ICD-10-CM

## 2017-07-28 MED ORDER — HYDROCHLOROTHIAZIDE 12.5 MG PO CAPS: 12.5000 mg | ORAL_CAPSULE | Freq: Every day | ORAL | 1 refills | Status: DC

## 2017-07-28 NOTE — Assessment & Plan Note (Addendum)
Physical today 07/28/17.  PAP 07/28/17.  Mammogram 08/25/16 - BiradsI Scheduled for  f/u mammogram 09/01/17.  Colonoscopy 2015.

## 2017-07-28 NOTE — Progress Notes (Signed)
Patient ID: Kimberly Madden, female   DOB: [birth year], 56 y.o.   MRN: [id number]   Subjective:    Patient ID: Kimberly Madden, female    DOB: December 06, 1960, 56 y.o.   MRN: [id number]  HPI  Patient here for her physical exam.  She reports she is doing well.  Feels good.  No chest pain. No sob.  No acid reflux.  No adominal pain.  Bowels moving.  No urine change.     Past Medical History:  Diagnosis Date  . Hepatitis C    Past Surgical History:  Procedure Laterality Date  . BREAST BIOPSY Left 2006   neg  . BREAST SURGERY Left    benign biopsy   Family History  Problem Relation Age of Onset  . Cancer Paternal Aunt 31       breast cancer - died  . Cancer Paternal Aunt 53       lung cancer - smoker - died  . Diabetes Paternal Aunt   . Colon cancer Neg Hx   . Stomach cancer Neg Hx   . Breast cancer Neg Hx    Social History   Social History  . Marital status: Married    Spouse name: Smitty Cords  . Number of children: 1  . Years of education: 51   Occupational History  . Customer Service [employer]   Social History Main Topics  . Smoking status: Former Smoker    Years: 3.00    Types: Cigarettes    Quit date: 08/01/1983  . Smokeless tobacco: Never Used  . Alcohol use Yes  . Drug use: No  . Sexual activity: Not Asked   Other Topics Concern  . None   Social History Narrative   Sybol grew up in [place], [place]. She lives at home with her husband, Smitty Cords, of 20 years. Choua has a daughter, Drema Pry, who lives in [place]. Shamar and her husband have a dog named Will. She enjoys traveling and gardening. She has a vegetable garden that she tends to. She also enjoys spending time with friends and family. 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    Outpatient Encounter Prescriptions as of 07/28/2017  Medication Sig  . ibuprofen (ADVIL,MOTRIN) 200 MG tablet Take 200 mg by mouth as needed.  . hydrochlorothiazide (MICROZIDE) 12.5 MG capsule Take 1 capsule (12.5 mg  total) by mouth daily.   No facility-administered encounter medications on file as of 07/28/2017.     Review of Systems  Constitutional: Negative for appetite change and unexpected weight change.  HENT: Negative for congestion and sinus pressure.   Eyes: Negative for pain and visual disturbance.  Respiratory: Negative for cough, chest tightness and shortness of breath.   Cardiovascular: Negative for chest pain, palpitations and leg swelling.  Gastrointestinal: Negative for abdominal pain, diarrhea, nausea and vomiting.  Genitourinary: Negative for difficulty urinating and dysuria.  Musculoskeletal: Negative for back pain and joint swelling.  Skin: Negative for color change and rash.  Neurological: Negative for dizziness, light-headedness and headaches.  Hematological: Negative for adenopathy. Does not bruise/bleed easily.  Psychiatric/Behavioral: Negative for agitation and dysphoric mood.       Objective:    Physical Exam  Constitutional: She is oriented to person, place, and time. She appears well-developed and well-nourished. No distress.  HENT:  Nose: Nose normal.  Mouth/Throat: Oropharynx is clear and moist.  Eyes: Right eye exhibits no discharge. Left eye exhibits no discharge. No scleral icterus.  Neck: Neck supple. No thyromegaly present.  Cardiovascular:  Normal rate and regular rhythm.   Pulmonary/Chest: Breath sounds normal. No accessory muscle usage. No tachypnea. No respiratory distress. She has no decreased breath sounds. She has no wheezes. She has no rhonchi. Right breast exhibits no inverted nipple, no mass, no nipple discharge and no tenderness (no axillary adenopathy). Left breast exhibits no inverted nipple, no mass, no nipple discharge and no tenderness (no axilarry adenopathy).  Abdominal: Soft. Bowel sounds are normal. There is no tenderness.  Genitourinary:  Genitourinary Comments: Normal external genitalia.  Vaginal vault without lesions.  Cervix identified.   Pap smear performed.  Could not appreciate any adnexal masses or tenderness.    Musculoskeletal: She exhibits no edema or tenderness.  Lymphadenopathy:    She has no cervical adenopathy.  Neurological: She is alert and oriented to person, place, and time.  Skin: Skin is warm. No rash noted. No erythema.  Psychiatric: She has a normal mood and affect. Her behavior is normal.    BP (!) 158/80 (BP Location: Left Arm, Patient Position: Sitting, Cuff Size: Normal)   Pulse 67   Temp 98.6 F (37 C) (Oral)   Resp 14   Ht  (1.575 m)   Wt 161 lb 12.8 oz (73.4 kg)   LMP 11/06/2012   SpO2 98%   BMI 29.59 kg/m  Wt Readings from Last 3 Encounters:  07/28/17 161 lb 12.8 oz (73.4 kg)  06/16/16 154 lb (69.9 kg)  06/15/15 148 lb (67.1 kg)     Lab Results  Component Value Date   WBC 5.5 07/11/2014   HGB 13.2 07/11/2014   HCT 38 07/11/2014   PLT 208 07/11/2014   CHOL 175 07/11/2014   TRIG 87 07/11/2014   HDL 71 (A) 07/11/2014   LDLCALC 87 07/11/2014   ALT 26 07/11/2014   AST 43 (A) 07/11/2014   NA 140 07/11/2014   K 4.5 07/11/2014   CREATININE 0.8 07/11/2014   BUN 12 07/11/2014   TSH 3.31 07/11/2014    Mm Screening Breast Tomo Bilateral  Result Date: 08/25/2016 CLINICAL DATA:  Screening. EXAM: 2D DIGITAL SCREENING BILATERAL MAMMOGRAM WITH CAD AND ADJUNCT TOMO COMPARISON:  Previous exam(s). ACR Breast Density Category b: There are scattered areas of fibroglandular density. FINDINGS: There are no findings suspicious for malignancy. Images were processed with CAD. IMPRESSION: No mammographic evidence of malignancy. A result letter of this screening mammogram will be mailed directly to the patient. RECOMMENDATION: Screening mammogram in one year. (Code:SM-B-01Y) BI-RADS CATEGORY  1: Negative. Electronically Signed   By: Edwin Cap M.D.   On: 08/25/2016 16:41       Assessment & Plan:   Problem List Items Addressed This Visit    Essential hypertension    Persistent elevation  in her blood pressure.  Start hctz 12.5mg  q day.  Follow pressures.  Follow metabolic panel.        Relevant Medications   hydrochlorothiazide (MICROZIDE) 12.5 MG capsule   Other Relevant Orders   CBC with Differential/Platelet   Hepatic function panel   Lipid panel   TSH   Basic metabolic panel   Healthcare maintenance    Physical today 07/28/17.  PAP 07/28/17.  Mammogram 08/25/16 - BiradsI Scheduled for  f/u mammogram 09/01/17.  Colonoscopy 2015.        History of vitamin D deficiency    Follow  Vitamin D level.        Relevant Orders   VITAMIN D 25 Hydroxy (Vit-D Deficiency, Fractures)    Other Visit Diagnoses  Screening for cervical cancer    -  Primary   Relevant Orders   Cytology - PAP   Need for immunization against influenza       Relevant Orders   Flu Vaccine QUAD 36+ mos IM (Completed)       Dale Geneva, MD

## 2017-07-30 ENCOUNTER — Encounter: Payer: Self-pay | Admitting: Internal Medicine

## 2017-07-30 NOTE — Assessment & Plan Note (Signed)
Persistent elevation in her blood pressure.  Start hctz 12.5mg  q day.  Follow pressures.  Follow metabolic panel.

## 2017-07-30 NOTE — Assessment & Plan Note (Signed)
Follow  Vitamin D level.

## 2017-07-31 ENCOUNTER — Other Ambulatory Visit: Payer: Self-pay | Admitting: Internal Medicine

## 2017-08-07 LAB — PAP LB (LIQUID-BASED): PAP SMEAR COMMENT: 0

## 2017-08-09 LAB — SPECIMEN STATUS REPORT

## 2017-08-10 LAB — HPV, 16/18,45: HPV, high-risk: NEGATIVE

## 2017-08-10 LAB — SPECIMEN STATUS REPORT

## 2017-08-11 ENCOUNTER — Other Ambulatory Visit (INDEPENDENT_AMBULATORY_CARE_PROVIDER_SITE_OTHER): Payer: 59

## 2017-08-11 DIAGNOSIS — I10 Essential (primary) hypertension: Secondary | ICD-10-CM

## 2017-08-11 DIAGNOSIS — Z8639 Personal history of other endocrine, nutritional and metabolic disease: Secondary | ICD-10-CM

## 2017-08-11 NOTE — Addendum Note (Signed)
Addended by: Warden Fillers on: 08/11/2017 09:45 AM   Modules accepted: Orders

## 2017-08-13 LAB — HEPATIC FUNCTION PANEL
ALBUMIN: 4.2 g/dL (ref 3.5–5.5)
ALK PHOS: 61 IU/L (ref 39–117)
ALT: 10 IU/L (ref 0–32)
AST: 15 IU/L (ref 0–40)
Bilirubin Total: 0.3 mg/dL (ref 0.0–1.2)
Bilirubin, Direct: 0.1 mg/dL (ref 0.00–0.40)
Total Protein: 7.2 g/dL (ref 6.0–8.5)

## 2017-08-13 LAB — CBC WITH DIFFERENTIAL/PLATELET
Basophils Absolute: 0 10*3/uL (ref 0.0–0.2)
Basos: 0 %
EOS (ABSOLUTE): 0.2 10*3/uL (ref 0.0–0.4)
Eos: 2 %
Hematocrit: 34.2 % (ref 34.0–46.6)
Hemoglobin: 11.7 g/dL (ref 11.1–15.9)
Immature Grans (Abs): 0 10*3/uL (ref 0.0–0.1)
Immature Granulocytes: 0 %
Lymphocytes Absolute: 3.7 10*3/uL — ABNORMAL HIGH (ref 0.7–3.1)
Lymphs: 51 %
MCH: 29.8 pg (ref 26.6–33.0)
MCHC: 34.2 g/dL (ref 31.5–35.7)
MCV: 87 fL (ref 79–97)
Monocytes Absolute: 0.5 10*3/uL (ref 0.1–0.9)
Monocytes: 7 %
Neutrophils Absolute: 2.9 10*3/uL (ref 1.4–7.0)
Neutrophils: 40 %
Platelets: 235 10*3/uL (ref 150–379)
RBC: 3.93 x10E6/uL (ref 3.77–5.28)
RDW: 13 % (ref 12.3–15.4)
WBC: 7.4 10*3/uL (ref 3.4–10.8)

## 2017-08-13 LAB — BASIC METABOLIC PANEL
BUN/Creatinine Ratio: 14 (ref 9–23)
BUN: 12 mg/dL (ref 6–24)
CALCIUM: 9.6 mg/dL (ref 8.7–10.2)
CHLORIDE: 104 mmol/L (ref 96–106)
CO2: 23 mmol/L (ref 20–29)
Creatinine, Ser: 0.87 mg/dL (ref 0.57–1.00)
GFR calc Af Amer: 86 mL/min/{1.73_m2} (ref >59)
GFR calc non Af Amer: 74 mL/min/{1.73_m2} (ref >59)
GLUCOSE: 98 mg/dL (ref 65–99)
Potassium: 4.4 mmol/L (ref 3.5–5.2)
Sodium: 143 mmol/L (ref 134–144)

## 2017-08-13 LAB — LIPID PANEL
CHOL/HDL RATIO: 3.7 ratio (ref 0.0–4.4)
CHOLESTEROL TOTAL: 179 mg/dL (ref 100–199)
HDL: 49 mg/dL (ref >39)
LDL Calculated: 113 mg/dL — ABNORMAL HIGH (ref 0–99)
TRIGLYCERIDES: 83 mg/dL (ref 0–149)
VLDL Cholesterol Cal: 17 mg/dL (ref 5–40)

## 2017-08-13 LAB — VITAMIN D 25 HYDROXY (VIT D DEFICIENCY, FRACTURES): Vit D, 25-Hydroxy: 23.5 ng/mL — ABNORMAL LOW (ref 30.0–100.0)

## 2017-08-13 LAB — TSH: TSH: 3.37 u[IU]/mL (ref 0.450–4.500)

## 2017-08-16 ENCOUNTER — Telehealth: Payer: Self-pay | Admitting: Internal Medicine

## 2017-08-16 NOTE — Telephone Encounter (Signed)
Pt called back returning your call. Thank you!  Call pt @ [phone].

## 2017-08-16 NOTE — Telephone Encounter (Signed)
Please see lab results for documentation. 

## 2017-08-29 ENCOUNTER — Telehealth: Payer: Self-pay

## 2017-08-29 NOTE — Telephone Encounter (Signed)
After reviewing the form, she needs evaluation through physical therapy to determine limits, etc.  I can place order for referral if agreeable.

## 2017-08-29 NOTE — Telephone Encounter (Signed)
Pt seen 07/28/17 for neck pain will she need a f/u to have form filled out? Forms in red folder

## 2017-08-29 NOTE — Telephone Encounter (Signed)
LMTCB

## 2017-08-30 NOTE — Telephone Encounter (Signed)
Left message to return call to our office.  

## 2017-08-31 NOTE — Telephone Encounter (Signed)
Spoke to patient she does not want to have referral for PT. States that she spoke to [facility] and she was told that she only needs number 6 and 11 filled out and to put n/a on all the other. I informed I would send message back and ask if we can do that did inform that if any further information was needed you would not be able to do.   Placed in red folder I will put N/A in all spots after you decide if you are able to fill out or not as well as fill out information on last page for contact.

## 2017-09-01 ENCOUNTER — Ambulatory Visit
Admission: RE | Admit: 2017-09-01 | Discharge: 2017-09-01 | Disposition: A | Discharge status: Home / Self Care | Payer: 59 | Source: Ambulatory Visit | Attending: Internal Medicine | Admitting: Internal Medicine

## 2017-09-01 DIAGNOSIS — Z1231 Encounter for screening mammogram for malignant neoplasm of breast: Secondary | ICD-10-CM | POA: Insufficient documentation

## 2017-09-13 ENCOUNTER — Ambulatory Visit: Payer: 59 | Admitting: Internal Medicine

## 2017-09-18 NOTE — Telephone Encounter (Signed)
Started form and was holding until her appt - to complete (to have documentation regarding her back, etc).  She die not keep appt.  She will need to reschedule appt.  Form placed back in box - to hold for appt.

## 2017-09-18 NOTE — Telephone Encounter (Signed)
Left message to return call to our office. ppw in holding folder on my desk.

## 2017-09-19 NOTE — Telephone Encounter (Signed)
Spoke to patient she has f/u in January have put ppw in brown folder and made note in app desk.

## 2017-11-24 ENCOUNTER — Ambulatory Visit: Payer: 59 | Admitting: Internal Medicine

## 2018-02-05 ENCOUNTER — Ambulatory Visit: Payer: Self-pay | Admitting: Internal Medicine

## 2018-07-31 ENCOUNTER — Ambulatory Visit (INDEPENDENT_AMBULATORY_CARE_PROVIDER_SITE_OTHER): Payer: 59 | Admitting: Internal Medicine

## 2018-07-31 ENCOUNTER — Other Ambulatory Visit: Payer: Self-pay | Admitting: Internal Medicine

## 2018-07-31 ENCOUNTER — Encounter: Payer: Self-pay | Admitting: Internal Medicine

## 2018-07-31 VITALS — BP 138/78 | HR 72 | Temp 97.9°F | Resp 18 | Ht 62.0 in | Wt 157.0 lb

## 2018-07-31 DIAGNOSIS — Z0000 Encounter for general adult medical examination without abnormal findings: Secondary | ICD-10-CM

## 2018-07-31 DIAGNOSIS — I10 Essential (primary) hypertension: Secondary | ICD-10-CM | POA: Diagnosis not present

## 2018-07-31 DIAGNOSIS — Z124 Encounter for screening for malignant neoplasm of cervix: Secondary | ICD-10-CM | POA: Diagnosis not present

## 2018-07-31 DIAGNOSIS — Z1239 Encounter for other screening for malignant neoplasm of breast: Secondary | ICD-10-CM

## 2018-07-31 DIAGNOSIS — Z8639 Personal history of other endocrine, nutritional and metabolic disease: Secondary | ICD-10-CM

## 2018-07-31 MED ORDER — HYDROCHLOROTHIAZIDE 12.5 MG PO CAPS: 12.5000 mg | ORAL_CAPSULE | Freq: Every day | ORAL | 1 refills | Status: DC

## 2018-07-31 NOTE — Progress Notes (Signed)
Patient ID: Kimberly Madden, female   DOB: 13-Oct-1961, 57 y.o.   MRN: [id number]   Subjective:    Patient ID: Kimberly Madden, female    DOB: 10/17/1961, 57 y.o.   MRN: [id number]  HPI  Patient here for her physical exam.  She reports she is doing well.  Feels good.  Is walking.  No chest pain.  No sob.  No acid reflux.  No abdominal pain.  Bowels moving.  No urine change.  On no medication.  Stopped blood pressure medication.     Past Medical History:  Diagnosis Date  . Hepatitis C    Past Surgical History:  Procedure Laterality Date  . BREAST BIOPSY Left 2006   neg  . BREAST SURGERY Left    benign biopsy   Family History  Problem Relation Age of Onset  . Cancer Paternal Aunt 40       breast cancer - died  . Cancer Paternal Aunt 33       lung cancer - smoker - died  . Diabetes Paternal Aunt   . Colon cancer Neg Hx   . Stomach cancer Neg Hx   . Breast cancer Neg Hx    Social History   Socioeconomic History  . Marital status: Married    Spouse name: Smitty Cords  . Number of children: 1  . Years of education: 80  . Highest education level: Not on file  Occupational History  . Occupation: Nutritional therapist: [employer]  Social Needs  . Financial resource strain: Not on file  . Food insecurity:    Worry: Not on file    Inability: Not on file  . Transportation needs:    Medical: Not on file    Non-medical: Not on file  Tobacco Use  . Smoking status: Former Smoker    Years: 3.00    Types: Cigarettes    Last attempt to quit: 08/01/1983    Years since quitting: 35.0  . Smokeless tobacco: Never Used  Substance and Sexual Activity  . Alcohol use: Yes  . Drug use: No  . Sexual activity: Not on file  Lifestyle  . Physical activity:    Days per week: Not on file    Minutes per session: Not on file  . Stress: Not on file  Relationships  . Social connections:    Talks on phone: Not on file    Gets together: Not on file    Attends religious service: Not on file   Active member of club or organization: Not on file    Attends meetings of clubs or organizations: Not on file    Relationship status: Not on file  Other Topics Concern  . Not on file  Social History Narrative   Tajuana grew up in [place], [place]. She lives at home with her husband, Smitty Cords, of 20 years. Shenice has a daughter, Drema Pry, who lives in [place]. Jaella and her husband have a dog named Will. She enjoys traveling and gardening. She has a vegetable garden that she tends to. She also enjoys spending time with friends and family. 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    Outpatient Encounter Medications as of 07/31/2018  Medication Sig  . hydrochlorothiazide (MICROZIDE) 12.5 MG capsule Take 1 capsule (12.5 mg total) by mouth daily.  . [DISCONTINUED] hydrochlorothiazide (MICROZIDE) 12.5 MG capsule Take 1 capsule (12.5 mg total) by mouth daily.  . [DISCONTINUED] ibuprofen (ADVIL,MOTRIN) 200 MG tablet Take 200 mg by mouth as  needed.   No facility-administered encounter medications on file as of 07/31/2018.     Review of Systems  Constitutional: Negative for appetite change and unexpected weight change.  HENT: Negative for congestion and sinus pressure.   Eyes: Negative for pain and visual disturbance.  Respiratory: Negative for cough, chest tightness and shortness of breath.   Cardiovascular: Negative for chest pain, palpitations and leg swelling.  Gastrointestinal: Negative for abdominal pain, diarrhea, nausea and vomiting.  Genitourinary: Negative for difficulty urinating and dysuria.  Musculoskeletal: Negative for joint swelling and myalgias.  Skin: Negative for color change and rash.  Neurological: Negative for dizziness, light-headedness and headaches.  Hematological: Negative for adenopathy. Does not bruise/bleed easily.  Psychiatric/Behavioral: Negative for agitation and dysphoric mood.       Objective:    Physical Exam  Constitutional: She is  oriented to person, place, and time. She appears well-developed and well-nourished. No distress.  HENT:  Nose: Nose normal.  Mouth/Throat: Oropharynx is clear and moist.  Eyes: Right eye exhibits no discharge. Left eye exhibits no discharge. No scleral icterus.  Neck: Neck supple. No thyromegaly present.  Cardiovascular: Normal rate and regular rhythm.  Pulmonary/Chest: Breath sounds normal. No accessory muscle usage. No tachypnea. No respiratory distress. She has no decreased breath sounds. She has no wheezes. She has no rhonchi. Right breast exhibits no inverted nipple, no mass, no nipple discharge and no tenderness (no axillary adenopathy). Left breast exhibits no inverted nipple, no mass, no nipple discharge and no tenderness (no axilarry adenopathy).  Abdominal: Soft. Bowel sounds are normal. There is no tenderness.  Genitourinary:  Genitourinary Comments: Normal external genitalia.  Vaginal vault without lesions.  Cervix identified.  Pap smear performed.  Could not appreciate any adnexal masses or tenderness.    Musculoskeletal: She exhibits no edema or tenderness.  Lymphadenopathy:    She has no cervical adenopathy.  Neurological: She is alert and oriented to person, place, and time.  Skin: No rash noted. No erythema.  Psychiatric: She has a normal mood and affect. Her behavior is normal.    BP 138/78 (BP Location: Left Arm, Patient Position: Sitting, Cuff Size: Normal)   Pulse 72   Temp 97.9 F (36.6 C) (Oral)   Resp 18   Ht 5\' 2"  (1.575 m)   Wt 157 lb (71.2 kg)   LMP 11/06/2012   SpO2 99%   BMI 28.72 kg/m  Wt Readings from Last 3 Encounters:  07/31/18 157 lb (71.2 kg)  07/28/17 161 lb 12.8 oz (73.4 kg)  06/16/16 154 lb (69.9 kg)     Lab Results  Component Value Date   WBC 7.4 08/11/2017   HGB 11.7 08/11/2017   HCT 34.2 08/11/2017   PLT 235 08/11/2017   GLUCOSE 98 08/11/2017   CHOL 179 08/11/2017   TRIG 83 08/11/2017   HDL 49 08/11/2017   LDLCALC 113 (H)  08/11/2017   ALT 10 08/11/2017   AST 15 08/11/2017   NA 143 08/11/2017   K 4.4 08/11/2017   CL 104 08/11/2017   CREATININE 0.87 08/11/2017   BUN 12 08/11/2017   CO2 23 08/11/2017   TSH 3.370 08/11/2017    Mm Screening Breast Tomo Bilateral  Result Date: 09/04/2017 CLINICAL DATA:  Screening. EXAM: 2D DIGITAL SCREENING BILATERAL MAMMOGRAM WITH CAD AND ADJUNCT TOMO COMPARISON:  Previous exam(s). ACR Breast Density Category b: There are scattered areas of fibroglandular density. FINDINGS: There are no findings suspicious for malignancy. Images were processed with CAD. IMPRESSION: No mammographic evidence  of malignancy. A result letter of this screening mammogram will be mailed directly to the patient. RECOMMENDATION: Screening mammogram in one year. (Code:SM-B-01Y) BI-RADS CATEGORY  1: Negative. Electronically Signed   By: Baird Lyons M.D.   On: 09/04/2017 12:51       Assessment & Plan:   Problem List Items Addressed This Visit    Essential hypertension    Blood pressure elevated.  Restart hctz.  Follow pressures.  Follow metabolic panel.        Relevant Medications   hydrochlorothiazide (MICROZIDE) 12.5 MG capsule   Other Relevant Orders   CBC with Differential/Platelet   Hepatic function panel   Lipid panel   TSH   Basic metabolic panel   Healthcare maintenance    Physical today 07/31/18.  PAP today 07/31/18.  Mammogram 11.5.18 - [facility].  Colonoscopy 2015.        History of vitamin D deficiency    Follow vitamin D level.        Relevant Orders   VITAMIN D 25 Hydroxy (Vit-D Deficiency, Fractures)    Other Visit Diagnoses    Routine general medical examination at a health care facility    -  Primary   Cervical cancer screening       Relevant Orders   Cytology - PAP   Breast cancer screening       Relevant Orders   MM 3D SCREEN BREAST BILATERAL       Dale [place], MD

## 2018-07-31 NOTE — Assessment & Plan Note (Signed)
Physical today 07/31/18.  PAP today 07/31/18.  Mammogram 11.5.18 - [facility].  Colonoscopy 2015.

## 2018-08-05 ENCOUNTER — Encounter: Payer: Self-pay | Admitting: Internal Medicine

## 2018-08-05 NOTE — Assessment & Plan Note (Signed)
Blood pressure elevated.  Restart hctz.  Follow pressures.  Follow metabolic panel.

## 2018-08-05 NOTE — Assessment & Plan Note (Signed)
Follow vitamin D level.  

## 2018-08-07 LAB — PAP LB AND HPV HIGH-RISK: PAP Smear Comment: 0

## 2018-08-07 LAB — HPV, LOW VOLUME (REFLEX): HPV low volume reflex: NEGATIVE

## 2018-08-08 ENCOUNTER — Encounter: Payer: Self-pay | Admitting: Internal Medicine

## 2018-08-23 LAB — HEPATIC FUNCTION PANEL
ALBUMIN: 4.4 g/dL (ref 3.5–5.5)
ALT: 9 IU/L (ref 0–32)
AST: 14 IU/L (ref 0–40)
Alkaline Phosphatase: 52 IU/L (ref 39–117)
Bilirubin Total: 0.4 mg/dL (ref 0.0–1.2)
Bilirubin, Direct: 0.1 mg/dL (ref 0.00–0.40)
TOTAL PROTEIN: 7.5 g/dL (ref 6.0–8.5)

## 2018-08-23 LAB — CBC WITH DIFFERENTIAL/PLATELET
BASOS ABS: 0 10*3/uL (ref 0.0–0.2)
Basos: 0 %
EOS (ABSOLUTE): 0.2 10*3/uL (ref 0.0–0.4)
EOS: 2 %
HEMATOCRIT: 37.2 % (ref 34.0–46.6)
Hemoglobin: 12.6 g/dL (ref 11.1–15.9)
IMMATURE GRANULOCYTES: 0 %
Immature Grans (Abs): 0 10*3/uL (ref 0.0–0.1)
Lymphocytes Absolute: 3.6 10*3/uL — ABNORMAL HIGH (ref 0.7–3.1)
Lymphs: 53 %
MCH: 30.1 pg (ref 26.6–33.0)
MCHC: 33.9 g/dL (ref 31.5–35.7)
MCV: 89 fL (ref 79–97)
MONOS ABS: 0.8 10*3/uL (ref 0.1–0.9)
Monocytes: 11 %
NEUTROS PCT: 34 %
Neutrophils Absolute: 2.4 10*3/uL (ref 1.4–7.0)
PLATELETS: 235 10*3/uL (ref 150–450)
RBC: 4.19 x10E6/uL (ref 3.77–5.28)
RDW: 12.9 % (ref 12.3–15.4)
WBC: 7 10*3/uL (ref 3.4–10.8)

## 2018-08-23 LAB — LIPID PANEL
CHOL/HDL RATIO: 3.8 ratio (ref 0.0–4.4)
Cholesterol, Total: 207 mg/dL — ABNORMAL HIGH (ref 100–199)
HDL: 55 mg/dL (ref >39)
LDL CALC: 134 mg/dL — AB (ref 0–99)
TRIGLYCERIDES: 89 mg/dL (ref 0–149)
VLDL CHOLESTEROL CAL: 18 mg/dL (ref 5–40)

## 2018-08-23 LAB — BASIC METABOLIC PANEL
BUN/Creatinine Ratio: 15 (ref 9–23)
BUN: 14 mg/dL (ref 6–24)
CHLORIDE: 102 mmol/L (ref 96–106)
CO2: 24 mmol/L (ref 20–29)
CREATININE: 0.94 mg/dL (ref 0.57–1.00)
Calcium: 9.8 mg/dL (ref 8.7–10.2)
GFR, EST AFRICAN AMERICAN: 77 mL/min/{1.73_m2} (ref >59)
GFR, EST NON AFRICAN AMERICAN: 67 mL/min/{1.73_m2} (ref >59)
Glucose: 86 mg/dL (ref 65–99)
Potassium: 4.2 mmol/L (ref 3.5–5.2)
Sodium: 141 mmol/L (ref 134–144)

## 2018-08-23 LAB — VITAMIN D 25 HYDROXY (VIT D DEFICIENCY, FRACTURES): Vit D, 25-Hydroxy: 21.5 ng/mL — ABNORMAL LOW (ref 30.0–100.0)

## 2018-08-23 LAB — TSH: TSH: 3.16 u[IU]/mL (ref 0.450–4.500)

## 2018-08-27 ENCOUNTER — Telehealth: Payer: Self-pay | Admitting: Internal Medicine

## 2018-08-27 NOTE — Telephone Encounter (Signed)
Pap results were received

## 2018-08-27 NOTE — Telephone Encounter (Signed)
Pt given results per Dr Lorin Picket, notify pt that her cholesterol increased some when compared to the previous check. Recommend a low cholesterol diet and exercise. We will follow. Vitamin D level low. If not taking any vitamin D, then start vitamin D3 1000 units per day. We will follow. Hgb, thyroid test, kidney function tests and liver function tests are wnl. I need her pap smear result. It was sent to [facility] - we still have not received. Please call and have the results sent to [place]. Hold message until we receive."; the pt verbalized understanding; she also says that the pap smear result should be back by now; will route to office for notification; also see result note dated 08/27/18.

## 2018-09-04 ENCOUNTER — Ambulatory Visit
Admission: RE | Admit: 2018-09-04 | Discharge: 2018-09-04 | Disposition: A | Discharge status: Home / Self Care | Payer: 59 | Source: Ambulatory Visit | Attending: Internal Medicine | Admitting: Internal Medicine

## 2018-09-04 DIAGNOSIS — Z1239 Encounter for other screening for malignant neoplasm of breast: Secondary | ICD-10-CM

## 2018-10-11 ENCOUNTER — Ambulatory Visit: Payer: Self-pay | Admitting: Internal Medicine

## 2018-11-25 ENCOUNTER — Other Ambulatory Visit: Payer: Self-pay | Admitting: Internal Medicine

## 2019-11-15 ENCOUNTER — Telehealth: Payer: Self-pay | Admitting: Internal Medicine

## 2019-11-15 ENCOUNTER — Ambulatory Visit (INDEPENDENT_AMBULATORY_CARE_PROVIDER_SITE_OTHER): Payer: 59 | Admitting: Internal Medicine

## 2019-11-15 ENCOUNTER — Other Ambulatory Visit: Payer: Self-pay

## 2019-11-15 DIAGNOSIS — U071 COVID-19: Secondary | ICD-10-CM

## 2019-11-15 DIAGNOSIS — Z0289 Encounter for other administrative examinations: Secondary | ICD-10-CM | POA: Diagnosis not present

## 2019-11-15 DIAGNOSIS — F439 Reaction to severe stress, unspecified: Secondary | ICD-10-CM

## 2019-11-15 MED ORDER — TRIAMCINOLONE ACETONIDE 55 MCG/ACT NA AERO: 2.0000 | INHALATION_SPRAY | Freq: Every day | NASAL | 0 refills | Status: DC

## 2019-11-15 NOTE — Addendum Note (Signed)
Addended by: Charm Barges on: 11/15/2019 08:27 PM   Modules accepted: Orders

## 2019-11-15 NOTE — Progress Notes (Signed)
Patient ID: Tysha Grismore, female   DOB: Jul 05, 1961, 59 y.o.   MRN: [id number]   Virtual Visit via video Note  This visit type was conducted due to national recommendations for restrictions regarding the COVID-19 pandemic (e.g. social distancing).  This format is felt to be most appropriate for this patient at this time.  All issues noted in this document were discussed and addressed.  No physical exam was performed (except for noted visual exam findings with Video Visits).   I connected with Deshon Koslowski by a video enabled telemedicine application or telephone and verified that I am speaking with the correct person using two identifiers. Location patient: home Location provider: work  Persons participating in the virtual visit: patient, provider  The limitations, risks, security and privacy concerns of performing an evaluation and management service by video and the availability of in person appointments have been discussed.  The patient expressed understanding and agreed to proceed.   Reason for visit: work in appt.   HPI: Tested positive for covid 11/05/19.  States 11/04/19 - felt faint.  Symptoms progressed.  Reports increased sinus congestion and head congestion.  Also throat congestion.  No sore throat.  Some sob with exertion.  Increased drainage.  Some cough.  Clear mucus production.  Previous loss of taste and smell.  Better now.  She is eating.  No vomiting.  No diarrhea.  Was taking alka seltzer plus.  Took mucinex over the last 24 hours.  Helped.  Cough/congestion - some better today.  No chest tightness or chest pain.  Needs FMLA paperwork completed.  Increased stress with her medical issues and work.  Mother has been in the hospital.  Family member also recently passed away.  Discussed with her today.  Desires no further intervention.  Has good support.  Follow closely.     ROS: See pertinent positives and negatives per HPI.  Past Medical History:  Diagnosis Date  . Hepatitis C      Past Surgical History:  Procedure Laterality Date  . BREAST BIOPSY Left 2006   neg  . BREAST SURGERY Left    benign biopsy    Family History  Problem Relation Age of Onset  . Cancer Paternal Aunt 35       breast cancer - died  . Cancer Paternal Aunt 77       lung cancer - smoker - died  . Diabetes Paternal Aunt   . Colon cancer Neg Hx   . Stomach cancer Neg Hx   . Breast cancer Neg Hx     SOCIAL HX: reviewed.    Current Outpatient Medications:  .  triamcinolone (NASACORT) 55 MCG/ACT AERO nasal inhaler, Place 2 sprays into the nose daily. Do this in the evening, Disp: 1 Inhaler, Rfl: 0  EXAM:  GENERAL: alert, oriented, appears well and in no acute distress  HEENT: atraumatic, conjunttiva clear, no obvious abnormalities on inspection of external nose and ears  NECK: normal movements of the head and neck  LUNGS: on inspection no signs of respiratory distress, breathing rate appears normal, no obvious gross SOB, gasping or wheezing  CV: no obvious cyanosis  PSYCH/NEURO: pleasant and cooperative, no obvious depression or anxiety, speech and thought processing grossly intact  ASSESSMENT AND PLAN:  Discussed the following assessment and plan:  COVID-19 virus infection Diagnosed with covid 11/05/19.  Symptoms as outlined.  10 days since diagnosis.  Treat symptoms with nasacort nasal spray and saline nasal spray as directed.  Just started mucinex.  Helped.  Continue.  Rest.  Fluids.  Discussed monitoring for any worsening symptoms.  Some sob with increased exertion.  Hold steroids at this time.  Follow closely.  Schedule f/u next week to reassess.  Discussed remaining quarantined.  Discussed home monitoring.  She declines.    Stress Increased stress as outlined.  Discussed with her today.  She declines any further intervention at this time.  Has good support.  Husband supportive.  Follow.    Encounter for completion of form with patient Needs FMLA paperwork completed.   Discussed.  Out of work since 11/04/19.  Will recheck next week.  Return to work date to be determined.    I discussed the assessment and treatment plan with the patient. The patient was provided an opportunity to ask questions and all were answered. The patient agreed with the plan and demonstrated an understanding of the instructions.   The patient was advised to call back or seek an in-person evaluation if the symptoms worsen or if the condition fails to improve as anticipated.   Einar Pheasant, MD

## 2019-11-15 NOTE — Telephone Encounter (Signed)
My chart message sent to pt for update.   

## 2019-11-15 NOTE — Telephone Encounter (Signed)
rx sent in for nasacort

## 2019-11-16 ENCOUNTER — Encounter: Payer: Self-pay | Admitting: Internal Medicine

## 2019-11-16 DIAGNOSIS — Z0289 Encounter for other administrative examinations: Secondary | ICD-10-CM | POA: Insufficient documentation

## 2019-11-16 DIAGNOSIS — F439 Reaction to severe stress, unspecified: Secondary | ICD-10-CM | POA: Insufficient documentation

## 2019-11-16 DIAGNOSIS — U071 COVID-19: Secondary | ICD-10-CM | POA: Insufficient documentation

## 2019-11-16 NOTE — Assessment & Plan Note (Addendum)
Diagnosed with covid 11/05/19.  Symptoms as outlined.  10 days since diagnosis.  Treat symptoms with nasacort nasal spray and saline nasal spray as directed.  Just started mucinex.  Helped.  Continue.  Rest.  Fluids.  Discussed monitoring for any worsening symptoms.  Some sob with increased exertion.  Hold steroids at this time.  Follow closely.  Schedule f/u next week to reassess.  Discussed remaining quarantined.  Discussed home monitoring.  She declines.

## 2019-11-16 NOTE — Assessment & Plan Note (Signed)
Needs FMLA paperwork completed.  Discussed.  Out of work since 11/04/19.  Will recheck next week.  Return to work date to be determined.

## 2019-11-16 NOTE — Assessment & Plan Note (Signed)
Increased stress as outlined.  Discussed with her today.  She declines any further intervention at this time.  Has good support.  Husband supportive.  Follow.

## 2019-11-19 ENCOUNTER — Telehealth: Payer: Self-pay | Admitting: Internal Medicine

## 2019-11-19 NOTE — Telephone Encounter (Signed)
Pt called and thought she was supposed to get a phone call from Dr Lorin Picket today. Pt states that she is not feeling any better. Please advise.

## 2019-11-20 NOTE — Telephone Encounter (Signed)
Ok to add. Thanks.   

## 2019-11-20 NOTE — Telephone Encounter (Signed)
Spoke with patient. She is feeling much better today. Had a slight head ache this morning. Tylenol helped. Pt stated you wanted to do a f/u with her this week. She is okay to do any time but would like to discuss going back to work next week. Are you ok to add her on at 4:30 tomorrow?

## 2019-11-20 NOTE — Telephone Encounter (Signed)
LMTCB

## 2019-11-21 ENCOUNTER — Other Ambulatory Visit: Payer: Self-pay

## 2019-11-21 ENCOUNTER — Ambulatory Visit (INDEPENDENT_AMBULATORY_CARE_PROVIDER_SITE_OTHER): Payer: 59 | Admitting: Internal Medicine

## 2019-11-21 DIAGNOSIS — U071 COVID-19: Secondary | ICD-10-CM

## 2019-11-21 DIAGNOSIS — Z0289 Encounter for other administrative examinations: Secondary | ICD-10-CM | POA: Diagnosis not present

## 2019-11-21 NOTE — Telephone Encounter (Signed)
Pt scheduled  

## 2019-11-21 NOTE — Progress Notes (Signed)
Patient ID: Kimberly Madden, female   DOB: February 16, 1961, 59 y.o.   MRN: [id number]   Virtual Visit via video Note  This visit type was conducted due to national recommendations for restrictions regarding the COVID-19 pandemic (e.g. social distancing).  This format is felt to be most appropriate for this patient at this time.  All issues noted in this document were discussed and addressed.  No physical exam was performed (except for noted visual exam findings with Video Visits).   I connected with Hilary Milks today by a video enabled telemedicine application and verified that I am speaking with the correct person using two identifiers. Location patient: home Location provider: work Persons participating in the virtual visit: patient, provider  The limitations, risks, security and privacy concerns of performing an evaluation and management service by video and the availability of in person appointments have been discussed.  The patient expressed understanding and agreed to proceed.   Reason for visit: scheduled follow up.    HPI: Tested positive for covid 11/05/19.  See last note for details.  She has been taking mucinex.  Still feels tired.  Feels pressure behind her eyes.  Tylenol helps.  Congestion is better.  No increased sob.  Eating.  No nausea or vomiting.  No increased cough. Fatigue is a persistent issue.  Discussed returning to work.  Would like for her to remain out of work and then will gradually work back to a full schedule.     ROS: See pertinent positives and negatives per HPI.  Past Medical History:  Diagnosis Date  . Hepatitis C     Past Surgical History:  Procedure Laterality Date  . BREAST BIOPSY Left 2006   neg  . BREAST SURGERY Left    benign biopsy    Family History  Problem Relation Age of Onset  . Cancer Paternal Aunt 47       breast cancer - died  . Cancer Paternal Aunt 25       lung cancer - smoker - died  . Diabetes Paternal Aunt   . Colon cancer Neg Hx     . Stomach cancer Neg Hx   . Breast cancer Neg Hx     SOCIAL HX: reviewed.    Current Outpatient Medications:  .  triamcinolone (NASACORT) 55 MCG/ACT AERO nasal inhaler, Place 2 sprays into the nose daily. Do this in the evening, Disp: 1 Inhaler, Rfl: 0  EXAM:  GENERAL: alert, oriented, appears well and in no acute distress  HEENT: atraumatic, conjunttiva clear, no obvious abnormalities on inspection of external nose and ears  NECK: normal movements of the head and neck  LUNGS: on inspection no signs of respiratory distress, breathing rate appears normal, no obvious gross SOB, gasping or wheezing  CV: no obvious cyanosis  PSYCH/NEURO: pleasant and cooperative, no obvious depression or anxiety, speech and thought processing grossly intact  ASSESSMENT AND PLAN:  Discussed the following assessment and plan:  COVID-19 virus infection Diagnosed with covid 11/05/19.  Persistent fatigue.  Congestion is better.  Continue mucinex.  Rest.  Fluids.  Remain out of work.  Will have her remain out of work until 11/28/19.  Plan to return to work will be 11/28/19 for 1/2 day, 11/29/19 for 1/2 day and on Monday 12/02/19 - 1/2 day and then return to full day if doing well on 12/03/19.  FMLA form completion.    Encounter for completion of form with patient FMLA form completion.     I discussed the  assessment and treatment plan with the patient. The patient was provided an opportunity to ask questions and all were answered. The patient agreed with the plan and demonstrated an understanding of the instructions.   The patient was advised to call back or seek an in-person evaluation if the symptoms worsen or if the condition fails to improve as anticipated.   Einar Pheasant, MD

## 2019-11-23 ENCOUNTER — Encounter: Payer: Self-pay | Admitting: Internal Medicine

## 2019-11-23 NOTE — Assessment & Plan Note (Signed)
FMLA form completion.  

## 2019-11-23 NOTE — Assessment & Plan Note (Signed)
Diagnosed with covid 11/05/19.  Persistent fatigue.  Congestion is better.  Continue mucinex.  Rest.  Fluids.  Remain out of work.  Will have her remain out of work until 11/28/19.  Plan to return to work will be 11/28/19 for 1/2 day, 11/29/19 for 1/2 day and on Monday 12/02/19 - 1/2 day and then return to full day if doing well on 12/03/19.  FMLA form completion.

## 2019-11-25 IMAGING — MG DIGITAL SCREENING BILATERAL MAMMOGRAM WITH TOMO AND CAD
8 series · 8 of 24 positions shown · non-contrast
Comparison: Previous exam(s).

CLINICAL DATA: Screening.

EXAM:
DIGITAL SCREENING BILATERAL MAMMOGRAM WITH TOMO AND CAD

[R CC synth-2D]
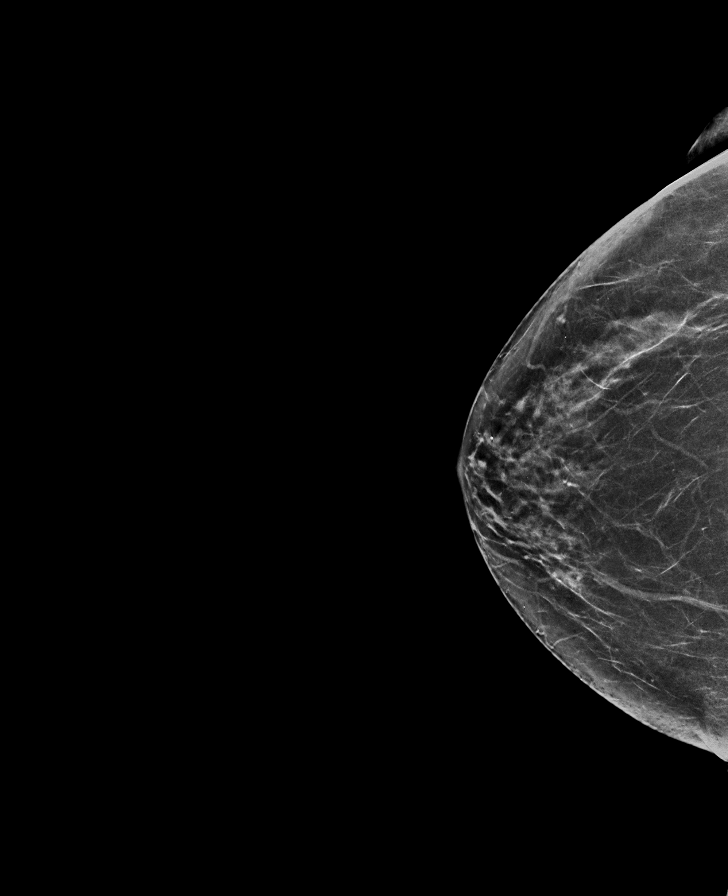

[L CC synth-2D]
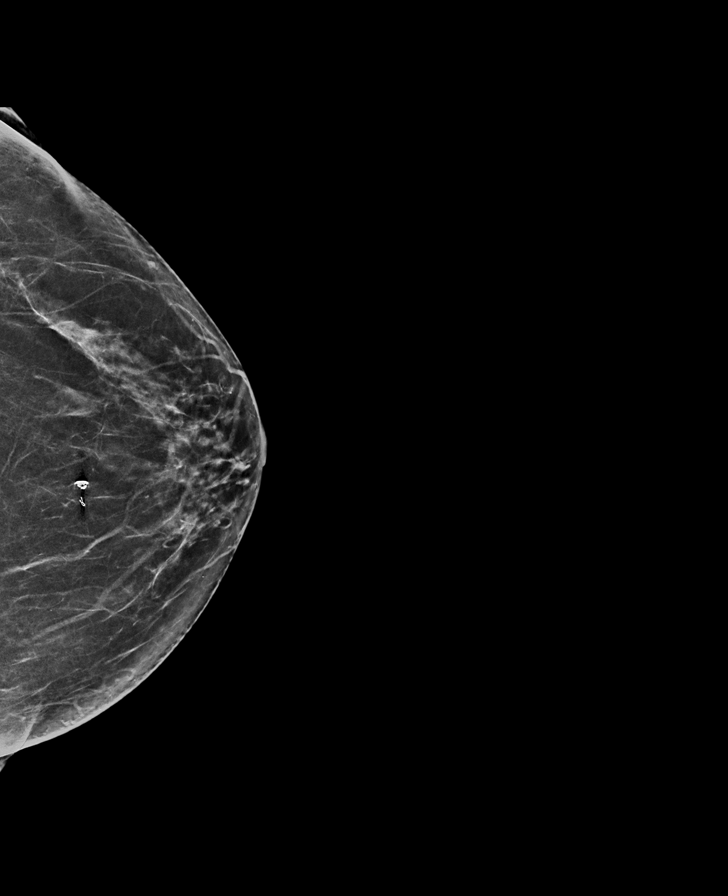

[L MLO synth-2D]
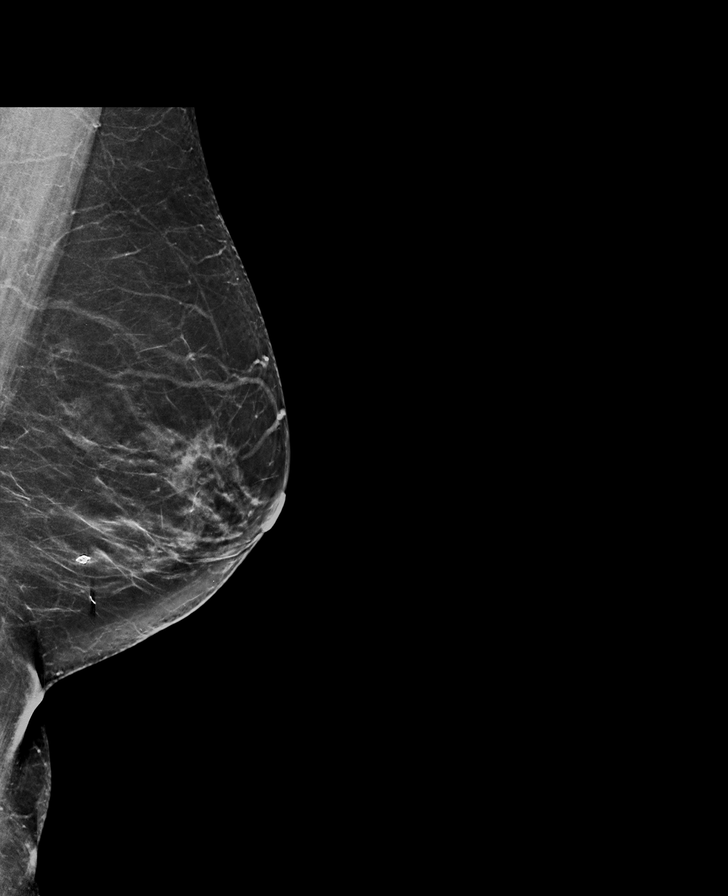

[R MLO synth-2D]
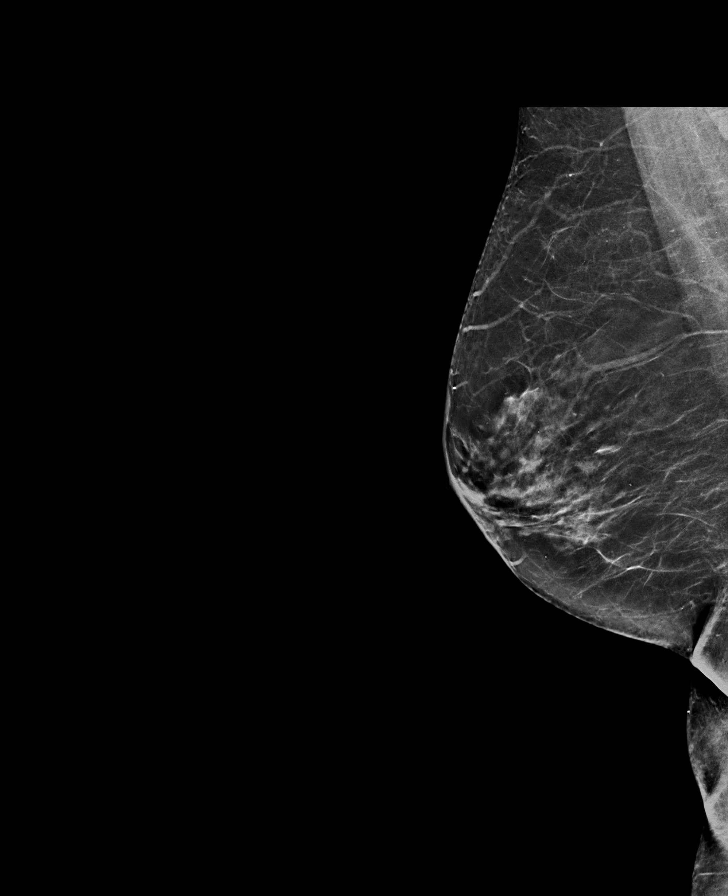

[R MLO tomo · tomo slice 33/65.0]
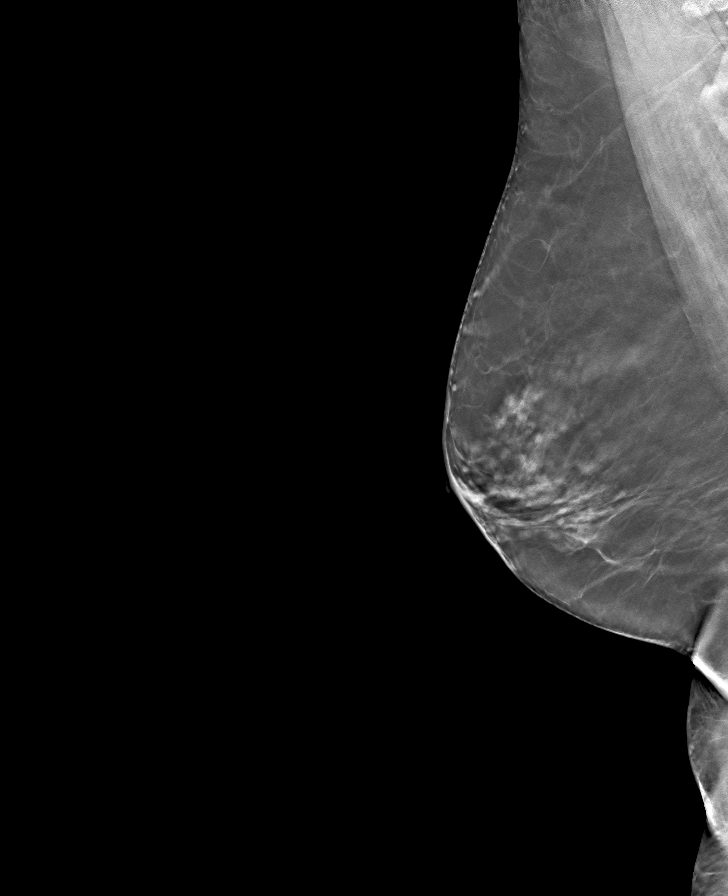

[R CC tomo · tomo slice 33/66.0]
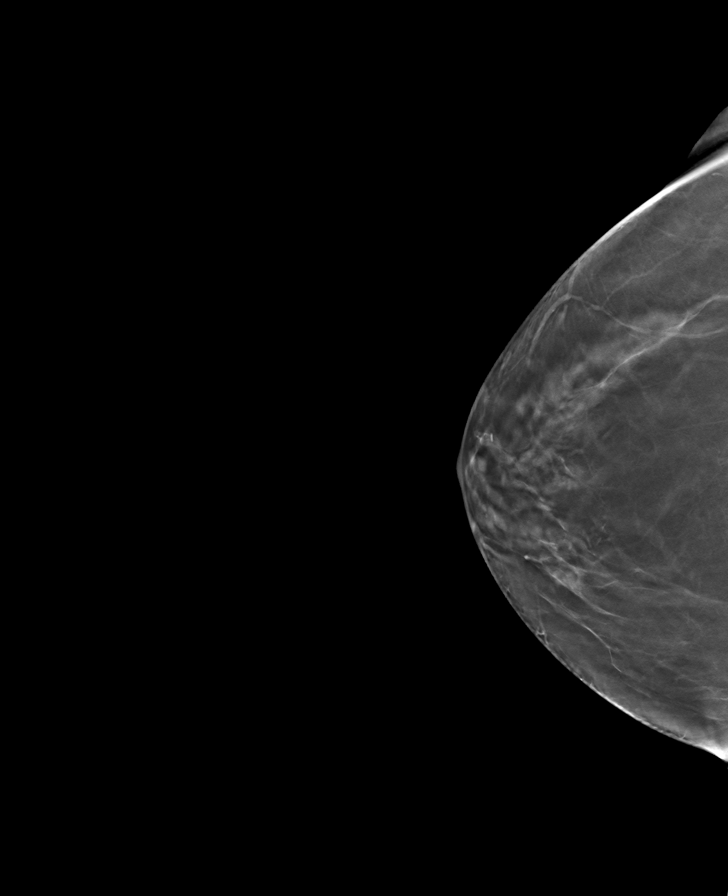

[L CC tomo · tomo slice 33/65.0]
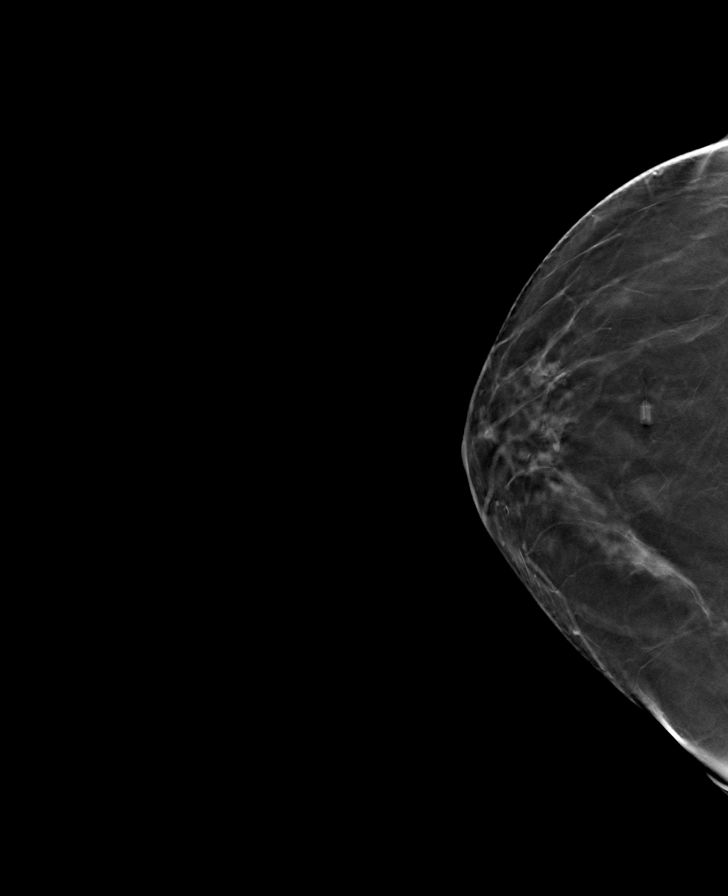

[L MLO tomo · tomo slice 35/69.0]
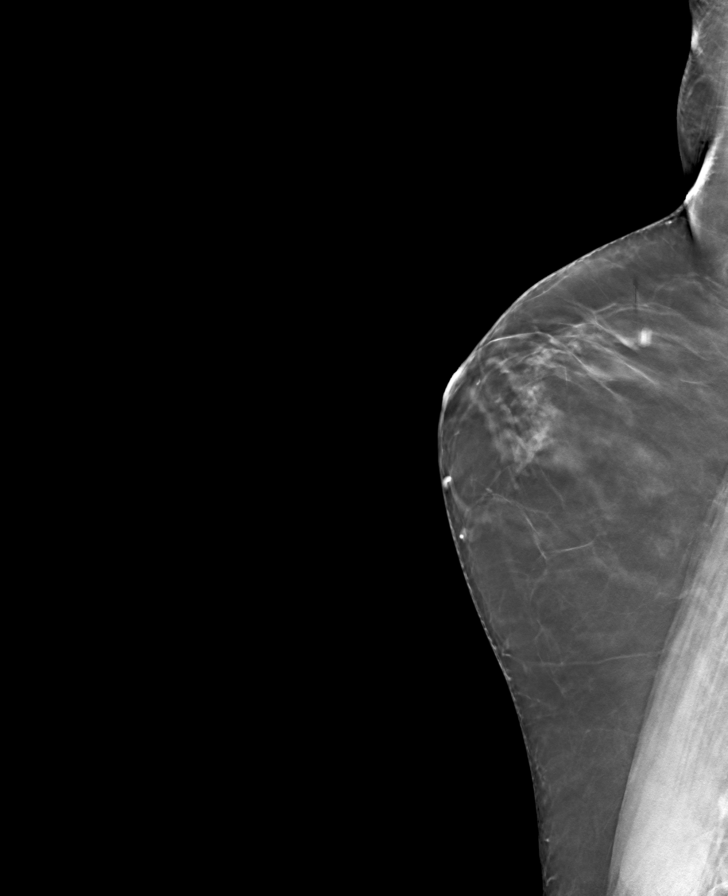

[8 of 24 positions shown; findings below may reference images not displayed]

ACR Breast Density Category b: There are scattered areas of
fibroglandular density.
FINDINGS: There are no findings suspicious for malignancy. Images were
processed with CAD.
IMPRESSION: No mammographic evidence of malignancy. A result letter of this
screening mammogram will be mailed directly to the patient.

RECOMMENDATION:
Screening mammogram in one year. (Code:[id number])

BI-RADS CATEGORY  1: Negative.

## 2019-11-27 ENCOUNTER — Telehealth: Payer: Self-pay | Admitting: Internal Medicine

## 2019-11-27 NOTE — Telephone Encounter (Signed)
Patient just needs a note to her employer stating, Kimberly Madden can return to work on 11/28/2019 at 1pm.  Please put it in patient's MyChart.

## 2019-11-27 NOTE — Telephone Encounter (Signed)
I faxed the paper work to [employer] stating that she can return to work for 1/2 day tomorrow. Are you okay with me writing a letter for her? I can type up and send to her in the AM if needed since she does not have to be at work until the afternoon. Advised that you are out of office this PM

## 2019-11-28 NOTE — Telephone Encounter (Signed)
Printed letter in case needs signed copy. Left message for patient to call back. If she wants me to fax, I need fax number to her work.

## 2019-11-28 NOTE — Telephone Encounter (Signed)
I have typed note and it should have been sent to pt through my chart.  Please confirm.  Also, need to confirm if we need to do anything more regarding her FMLA papers.  I completed what had been sent to me.

## 2019-11-28 NOTE — Telephone Encounter (Signed)
Noted  

## 2019-11-28 NOTE — Telephone Encounter (Signed)
Pt returned call and said she retrieved the letter from MyChart and gave to supervisor. Thank you for all your help.

## 2019-12-13 ENCOUNTER — Telehealth: Payer: Self-pay | Admitting: Internal Medicine

## 2019-12-13 ENCOUNTER — Other Ambulatory Visit
Admission: RE | Admit: 2019-12-13 | Discharge: 2019-12-13 | Disposition: A | Discharge status: Home / Self Care | Payer: Managed Care, Other (non HMO) | Source: Ambulatory Visit | Attending: Student | Admitting: Student

## 2019-12-13 DIAGNOSIS — R0602 Shortness of breath: Secondary | ICD-10-CM | POA: Insufficient documentation

## 2019-12-13 DIAGNOSIS — J029 Acute pharyngitis, unspecified: Secondary | ICD-10-CM | POA: Insufficient documentation

## 2019-12-13 LAB — FIBRIN DERIVATIVES D-DIMER (ARMC ONLY): Fibrin derivatives D-dimer (ARMC): 933.87 ng/mL (FEU) — ABNORMAL HIGH (ref 0.00–499.00)

## 2019-12-13 NOTE — Telephone Encounter (Signed)
Pt called wanting to let Dr. Lorin Picket know that she is having a headache and lost her voice also stated that EMS was called out last night and said that her right lung has discretion

## 2019-12-13 NOTE — Telephone Encounter (Signed)
Called pt to confirm doing ok. Advised that given her symptoms and EMS noting that there is distress in her right lung and she is post COVID, she should go to urgent care for evaluation to determine best treatment, need for x-ray/scans, etc. Discussed with Dr Lorin Picket and she agreed as well. Patient is going to [facility] urgent care.

## 2019-12-14 ENCOUNTER — Other Ambulatory Visit: Payer: Self-pay

## 2019-12-14 ENCOUNTER — Encounter: Payer: Self-pay | Admitting: Emergency Medicine

## 2019-12-14 ENCOUNTER — Emergency Department: Payer: Managed Care, Other (non HMO)

## 2019-12-14 ENCOUNTER — Emergency Department
Admission: EM | Admit: 2019-12-14 | Discharge: 2019-12-14 | Disposition: A | Discharge status: Home / Self Care | Payer: Managed Care, Other (non HMO) | Attending: Emergency Medicine | Admitting: Emergency Medicine

## 2019-12-14 DIAGNOSIS — I10 Essential (primary) hypertension: Secondary | ICD-10-CM | POA: Insufficient documentation

## 2019-12-14 DIAGNOSIS — Z87891 Personal history of nicotine dependence: Secondary | ICD-10-CM | POA: Diagnosis not present

## 2019-12-14 DIAGNOSIS — Z79899 Other long term (current) drug therapy: Secondary | ICD-10-CM | POA: Diagnosis not present

## 2019-12-14 DIAGNOSIS — R0602 Shortness of breath: Secondary | ICD-10-CM | POA: Diagnosis not present

## 2019-12-14 LAB — BASIC METABOLIC PANEL
Anion gap: 11 (ref 5–15)
BUN: 14 mg/dL (ref 6–20)
CO2: 24 mmol/L (ref 22–32)
Calcium: 9.6 mg/dL (ref 8.9–10.3)
Chloride: 106 mmol/L (ref 98–111)
Creatinine, Ser: 0.83 mg/dL (ref 0.44–1.00)
GFR calc Af Amer: >60 mL/min (ref >60)
GFR calc non Af Amer: >60 mL/min (ref >60)
Glucose, Bld: 115 mg/dL — ABNORMAL HIGH (ref 70–99)
Potassium: 3.9 mmol/L (ref 3.5–5.1)
Sodium: 141 mmol/L (ref 135–145)

## 2019-12-14 MED ORDER — IOHEXOL 350 MG/ML SOLN
75.0000 mL | Freq: Once | INTRAVENOUS | Status: AC | PRN
  Administered 2019-12-14: 75 mL via INTRAVENOUS

## 2019-12-14 NOTE — ED Notes (Signed)
First Nurse Note: Pt to ED via POV. Pt had D-dimer done at urgent care and it was elevated. Pt also has laryngitis.

## 2019-12-14 NOTE — ED Triage Notes (Addendum)
Pt sent from urgent care for CTA.  Pt was seen yesterday for mild SHOB and cough.  Started on abx for bronchitis and sinus infection but called back today because of elevated d-dimer.  CBC results in care everywhere. metabolic panel not done.  Denies pain including CP.  Pt unlabored and VSS at this time.  Voice is hoarse from cough.  Pt was covid + 11/05/19  Discussed with dr Cyril Loosen, at this time will place IV and do BMP/EKG.

## 2019-12-14 NOTE — ED Provider Notes (Signed)
[facility] Emergency Department Provider Note   ____________________________________________    I have reviewed the triage vital signs and the nursing notes.   HISTORY  Chief Complaint Shortness of Breath     HPI Kimberly Madden is a 59 y.o. female who was sent to the emergency department after having an elevated D-dimer test.  Patient reports he was seen at urgent care because of continued intermittent shortness of breath since having coronavirus 1 month ago.  She reports typically it is worse with exertion.  She denies pleurisy.  No fevers or chills.  Had a D-dimer test which was elevated, review of records demonstrates it was about 900.  No history of blood clots.  Denies chest pain  Past Medical History:  Diagnosis Date  . Hepatitis C     Patient Active Problem List   Diagnosis Date Noted  . COVID-19 virus infection 11/16/2019  . Stress 11/16/2019  . Encounter for completion of form with patient 11/16/2019  . Essential hypertension 06/26/2016  . Hepatitis C without hepatic coma 06/15/2015  . Healthcare maintenance 06/15/2015  . History of vitamin D deficiency 08/26/2013    Past Surgical History:  Procedure Laterality Date  . BREAST BIOPSY Left 2006   neg  . BREAST SURGERY Left    benign biopsy    Prior to Admission medications   Medication Sig Start Date End Date Taking? Authorizing Provider  triamcinolone (NASACORT) 55 MCG/ACT AERO nasal inhaler Place 2 sprays into the nose daily. Do this in the evening 11/15/19   Einar Pheasant, MD     Allergies Patient has no known allergies.  Family History  Problem Relation Age of Onset  . Cancer Paternal Aunt 17       breast cancer - died  . Cancer Paternal Aunt 44       lung cancer - smoker - died  . Diabetes Paternal Aunt   . Colon cancer Neg Hx   . Stomach cancer Neg Hx   . Breast cancer Neg Hx     Social History Social History   Tobacco Use  . Smoking status: Former Smoker      Years: 3.00    Types: Cigarettes    Quit date: 08/01/1983    Years since quitting: 36.3  . Smokeless tobacco: Never Used  Substance Use Topics  . Alcohol use: Yes  . Drug use: No    Review of Systems  Constitutional: No fever/chills Eyes: No visual changes.  ENT: No sore throat. Cardiovascular: As above Respiratory: As above Gastrointestinal: No abdominal pain.  No nausea, no vomiting.   Genitourinary: Negative for dysuria. Musculoskeletal: Negative for back pain. Skin: Negative for rash. Neurological: Negative for headaches or weakness   ____________________________________________   PHYSICAL EXAM:  VITAL SIGNS: ED Triage Vitals  Enc Vitals Group     BP 12/14/19 1218 (!) 193/71     Pulse Rate 12/14/19 1218 86     Resp 12/14/19 1218 18     Temp 12/14/19 1218 98.7 F (37.1 C)     Temp Source 12/14/19 1218 Oral     SpO2 12/14/19 1218 99 %     Weight 12/14/19 1217 76.7 kg (169 lb)     Height 12/14/19 1217 1.626 m (5\' 4" )     Head Circumference --      Peak Flow --      Pain Score 12/14/19 1216 0     Pain Loc --      Pain Edu? --  Excl. in GC? --     Constitutional: Alert and oriented.  Pleasant and interactive  Mouth/Throat: Mucous membranes are moist.   Neck:  Painless ROM Cardiovascular: Normal rate, regular rhythm. Grossly normal heart sounds.  Good peripheral circulation. Respiratory: Normal respiratory effort.  No retractions.   Musculoskeletal: No lower extremity tenderness nor edema.  Warm and well perfused Neurologic:  Normal speech and language. No gross focal neurologic deficits are appreciated.  Skin:  Skin is warm, dry and intact. No rash noted. Psychiatric: Mood and affect are normal. Speech and behavior are normal.  ____________________________________________   LABS (all labs ordered are listed, but only abnormal results are displayed)  Labs Reviewed  BASIC METABOLIC PANEL - Abnormal; Notable for the following components:       Result Value   Glucose, Bld 115 (*)    All other components within normal limits   ____________________________________________  EKG  ED ECG REPORT I, Jene Every, the attending physician, personally viewed and interpreted this ECG.  Date: 12/14/2019  Rhythm: normal sinus rhythm QRS Axis: normal Intervals: normal ST/T Wave abnormalities: normal Narrative Interpretation: no evidence of acute ischemia  ____________________________________________  RADIOLOGY  CT angiography negative for PE, scattered nodules, no follow-up needed as patient does not smoke, low risk ____________________________________________   PROCEDURES  Procedure(s) performed: No  Procedures   Critical Care performed: No ____________________________________________   INITIAL IMPRESSION / ASSESSMENT AND PLAN / ED COURSE  Pertinent labs & imaging results that were available during my care of the patient were reviewed by me and considered in my medical decision making (see chart for details).  Patient presents with reported elevated D-dimer, CT angiography is negative for PE, she is reassured by this.  She feels well, no hypoxia or tachycardia or fever here.  She will follow-up with her PCP    ____________________________________________   FINAL CLINICAL IMPRESSION(S) / ED DIAGNOSES  Final diagnoses:  SOB (shortness of breath)        Note:  This document was prepared using Dragon voice recognition software and may include unintentional dictation errors.   Jene Every, MD 12/14/19 [phone]

## 2019-12-19 ENCOUNTER — Encounter: Payer: Self-pay | Admitting: Internal Medicine

## 2019-12-19 ENCOUNTER — Other Ambulatory Visit: Payer: Self-pay

## 2019-12-19 ENCOUNTER — Ambulatory Visit (INDEPENDENT_AMBULATORY_CARE_PROVIDER_SITE_OTHER): Payer: 59 | Admitting: Internal Medicine

## 2019-12-19 DIAGNOSIS — U071 COVID-19: Secondary | ICD-10-CM | POA: Diagnosis not present

## 2019-12-19 DIAGNOSIS — F439 Reaction to severe stress, unspecified: Secondary | ICD-10-CM

## 2019-12-19 DIAGNOSIS — R9389 Abnormal findings on diagnostic imaging of other specified body structures: Secondary | ICD-10-CM | POA: Diagnosis not present

## 2019-12-19 NOTE — Assessment & Plan Note (Signed)
Overall appears to be handling things relatively well. 

## 2019-12-19 NOTE — Assessment & Plan Note (Signed)
Recently diagnosed with covid - 11/05/19.  Treated with mucinex, saline nasal spray and nasacort.  Improved initially.  Over the past week developed increased congestion and cough with head congestion.  Diagnosed with bronchitis/sinusitis.  Treated with prednisone and doxycycline.  D dimer elevated.  CT negative for PE.  She continues to be sob with exertion.  Still with cough and head congestion.  Increased fatigue.  Voice is better.  Hard for her to work currently.  She is feeling some better since being on prednisone and abx.  Will continue the prednisone and abx.  Add back saline nasal spray and nasacort. Continue mucinex.  Rest. Will have her remain out of work until 12/30/19.  Will plan to return to work 12/30/19- 1/2 days and then back full time 01/06/20.  Discussed with her regarding referral to pulmonary given persistent issues post covid. She was in agreement.

## 2019-12-19 NOTE — Assessment & Plan Note (Signed)
Chest CT with no evidence of PE.  Did have small scattered nodules measuring less than 4mm.  Treating ongoing symptoms/infection as outlined.  Refer to pulmonary given persistent symptoms post covid.  Will have Dr Thelma Barge review CT.

## 2019-12-19 NOTE — Progress Notes (Signed)
Patient ID: Kimberly Madden, female   DOB: 1961/09/10, 59 y.o.   MRN: [id number]   Virtual Visit via video Note  This visit type was conducted due to national recommendations for restrictions regarding the COVID-19 pandemic (e.g. social distancing).  This format is felt to be most appropriate for this patient at this time.  All issues noted in this document were discussed and addressed.  No physical exam was performed (except for noted visual exam findings with Video Visits).   I connected with Kimberly Madden by a video enabled telemedicine application and verified that I am speaking with the correct person using two identifiers. Location patient: home Location provider: work Persons participating in the virtual visit: patient, provider  The limitations, risks, security and privacy concerns of performing an evaluation and management service by video and the availability of in person appointments have been discussed. The patient expressed understanding and agreed to proceed.   Reason for visit: work in appt - ER follow up  HPI: She was diagnosed with COVID 11/05/19.  Symptoms improved initially.  Over the last week, she started noticing worsening symptoms.  Became more sob.  Increased head congestion, sinus congestion, cough and chest congestion.  Also noticed losing her voice.  Husband called EMS.  Was told could hear some "distress" in her right lung.  She went to acute care.  Strep negative.  Flu negative.  White blood cell count - 11.0.   Diagnosed with sinusitis/bronchitis.  Treated with doxycycline and prednisone.  Still taking.  D dimer - 933.  Was referred to ER.  CT revealed no evidence of PE.  Did have scattered small nodules measuring less than 27mm.  Recommended non contrast CT in 12 months.  Pulse ox 99% room air.  Discharged to continue abx and steroids.  Since her discharge from ER, she continues to have some sob - noticed more with exertion.  It is better.  She has been trying to work, but  is very fatigued.  Discussed remaining out of work and gradually getting back to a full schedule.  She is eating.  No nausea, vomiting or diarrhea.  No chest pain.  Does report still having head and nasal congestion.  No fever.  Eating and drinking.     ROS: See pertinent positives and negatives per HPI.  Past Medical History:  Diagnosis Date  . Hepatitis C     Past Surgical History:  Procedure Laterality Date  . BREAST BIOPSY Left 2006   neg  . BREAST SURGERY Left    benign biopsy    Family History  Problem Relation Age of Onset  . Cancer Paternal Aunt 41       breast cancer - died  . Cancer Paternal Aunt 40       lung cancer - smoker - died  . Diabetes Paternal Aunt   . Colon cancer Neg Hx   . Stomach cancer Neg Hx   . Breast cancer Neg Hx     SOCIAL HX: reviewed.    Current Outpatient Medications:  .  albuterol (VENTOLIN HFA) 108 (90 Base) MCG/ACT inhaler, Inhale into the lungs., Disp: , Rfl:  .  doxycycline (VIBRAMYCIN) 100 MG capsule, Take by mouth., Disp: , Rfl:  .  predniSONE (DELTASONE) 20 MG tablet, Take 20 mg by mouth daily., Disp: , Rfl:  .  triamcinolone (NASACORT) 55 MCG/ACT AERO nasal inhaler, Place 2 sprays into the nose daily. Do this in the evening, Disp: 1 Inhaler, Rfl: 0  EXAM:  GENERAL: alert, oriented, appears well and in no acute distress  HEENT: atraumatic, conjunttiva clear, no obvious abnormalities on inspection of external nose and ears  NECK: normal movements of the head and neck  LUNGS: on inspection no signs of respiratory distress, breathing rate appears normal, no obvious gross SOB, gasping or wheezing.  Some cough with talking.   CV: no obvious cyanosis  PSYCH/NEURO: pleasant and cooperative, no obvious depression or anxiety, speech and thought processing grossly intact  ASSESSMENT AND PLAN:  Discussed the following assessment and plan:  COVID-19 virus infection Recently diagnosed with covid - 11/05/19.  Treated with mucinex,  saline nasal spray and nasacort.  Improved initially.  Over the past week developed increased congestion and cough with head congestion.  Diagnosed with bronchitis/sinusitis.  Treated with prednisone and doxycycline.  D dimer elevated.  CT negative for PE.  She continues to be sob with exertion.  Still with cough and head congestion.  Increased fatigue.  Voice is better.  Hard for her to work currently.  She is feeling some better since being on prednisone and abx.  Will continue the prednisone and abx.  Add back saline nasal spray and nasacort. Continue mucinex.  Rest. Will have her remain out of work until 12/30/19.  Will plan to return to work 12/30/19- 1/2 days and then back full time 01/06/20.  Discussed with her regarding referral to pulmonary given persistent issues post covid. She was in agreement.    Abnormal chest CT Chest CT with no evidence of PE.  Did have small scattered nodules measuring less than 41mm.  Treating ongoing symptoms/infection as outlined.  Refer to pulmonary given persistent symptoms post covid.  Will have Dr Genevive Bi review CT.    Stress Overall appears to be handling things relatively well.     I discussed the assessment and treatment plan with the patient. The patient was provided an opportunity to ask questions and all were answered. The patient agreed with the plan and demonstrated an understanding of the instructions.   The patient was advised to call back or seek an in-person evaluation if the symptoms worsen or if the condition fails to improve as anticipated.   Einar Pheasant, MD

## 2019-12-30 ENCOUNTER — Telehealth: Payer: Self-pay | Admitting: Internal Medicine

## 2019-12-30 NOTE — Telephone Encounter (Signed)
Pt called she would like to call back about her FMLA paperwork before it is submitted

## 2019-12-30 NOTE — Telephone Encounter (Signed)
Received FMLA paper work. Pt stated just needs to be written for her to be out of work this week and return next week with NO mandatory over time. Pt was last seen on 2/18 to f/u on her breathing and urgent care evaluation. I can fill out as much as possible. I just wanted to confirm with you that you are ok with filling out.

## 2019-12-30 NOTE — Telephone Encounter (Signed)
Need clarification on a few things as we discussed.

## 2019-12-30 NOTE — Telephone Encounter (Signed)
LMTCB need to know what the FMLA paperwork is for before we can fill out.

## 2020-01-01 NOTE — Telephone Encounter (Signed)
Spoke with patient. She saw you on 2/18 for ED f/u. Discussed staying out of work. 2/19 through the next week. Patient stated that she would benefit more extending it out until 3/8 and then discuss returning half days. I have scheduled her for another follow up with you on 3/5 to discuss further. Holding paperwork appt.

## 2020-01-03 ENCOUNTER — Telehealth (INDEPENDENT_AMBULATORY_CARE_PROVIDER_SITE_OTHER): Payer: 59 | Admitting: Internal Medicine

## 2020-01-03 ENCOUNTER — Encounter: Payer: Self-pay | Admitting: Internal Medicine

## 2020-01-03 ENCOUNTER — Telehealth: Payer: Self-pay | Admitting: Internal Medicine

## 2020-01-03 DIAGNOSIS — U071 COVID-19: Secondary | ICD-10-CM

## 2020-01-03 DIAGNOSIS — R9389 Abnormal findings on diagnostic imaging of other specified body structures: Secondary | ICD-10-CM | POA: Diagnosis not present

## 2020-01-03 NOTE — Progress Notes (Signed)
Patient ID: Kimberly Madden, female   DOB: Mar 09, 1961, 59 y.o.   MRN: [id number]   Virtual Visit via video Note  This visit type was conducted due to national recommendations for restrictions regarding the COVID-19 pandemic (e.g. social distancing).  This format is felt to be most appropriate for this patient at this time.  All issues noted in this document were discussed and addressed.  No physical exam was performed (except for noted visual exam findings with Video Visits).   I connected with Kimberly Madden by a video enabled telemedicine application and verified that I am speaking with the correct person using two identifiers. Location patient: home Location provider: work  Persons participating in the virtual visit: patient, provider  The limitations, risks, security and privacy concerns of performing an evaluation and management service by telephone and the availability of in person appointments have been discussed. The patient expressed understanding and agreed to proceed.   Reason for visit: scheduled follow up.    HPI: Diagnosed with covid 11/05/19.  Symptoms improved.  No increased cough or congestion.  Increased fatigue.  Has still been trying to recover.  No chest pain.  No increased sob.  No acid reflux.  Eating and drinking.  No diarrhea.  Discussed returning to work.  Discussed the need to continue to be out this week with plans to return to work 1/2 days the week of 01/06/20 and full days starting 01/13/20.     ROS: See pertinent positives and negatives per HPI.  Past Medical History:  Diagnosis Date  . Hepatitis C     Past Surgical History:  Procedure Laterality Date  . BREAST BIOPSY Left 2006   neg  . BREAST SURGERY Left    benign biopsy    Family History  Problem Relation Age of Onset  . Cancer Paternal Aunt 55       breast cancer - died  . Cancer Paternal Aunt 86       lung cancer - smoker - died  . Diabetes Paternal Aunt   . Colon cancer Neg Hx   . Stomach cancer  Neg Hx   . Breast cancer Neg Hx     SOCIAL HX: reviewed.    Current Outpatient Medications:  .  Acetaminophen (TYLENOL PO), Take by mouth as needed., Disp: , Rfl:  .  albuterol (VENTOLIN HFA) 108 (90 Base) MCG/ACT inhaler, Inhale into the lungs., Disp: , Rfl:  .  triamcinolone (NASACORT) 55 MCG/ACT AERO nasal inhaler, Place 2 sprays into the nose daily. Do this in the evening, Disp: 1 Inhaler, Rfl: 0 .  predniSONE (DELTASONE) 20 MG tablet, Take 20 mg by mouth daily., Disp: , Rfl:   EXAM:  GENERAL: alert, oriented, appears well and in no acute distress  HEENT: atraumatic, conjunttiva clear, no obvious abnormalities on inspection of external nose and ears  NECK: normal movements of the head and neck  LUNGS: on inspection no signs of respiratory distress, breathing rate appears normal, no obvious gross SOB, gasping or wheezing  CV: no obvious cyanosis  PSYCH/NEURO: pleasant and cooperative, no obvious depression or anxiety, speech and thought processing grossly intact  ASSESSMENT AND PLAN:  Discussed the following assessment and plan:  COVID-19 virus infection Diagnosed with covid.  Symptoms improved.  No increased cough, congestion or sob.  Eating.  Still dealing with fatigue.  Unable to return to work this week.  Discussed plans to return to work next week 1/2 days and then full days starting 01/13/20.  Letter provided.  Abnormal chest CT Previous CT chest - no evidence of PE.  Small scattered nodules measuring less then 13mm.  Discuss with Dr Thelma Barge - to review CT.      I discussed the assessment and treatment plan with the patient. The patient was provided an opportunity to ask questions and all were answered. The patient agreed with the plan and demonstrated an understanding of the instructions.   The patient was advised to call back or seek an in-person evaluation if the symptoms worsen or if the condition fails to improve as anticipated.    Dale Eden, MD

## 2020-01-03 NOTE — Telephone Encounter (Signed)
Pt needs return to work note before Monday.

## 2020-01-03 NOTE — Telephone Encounter (Signed)
Work note typed.  Please contact pt and let her know and see if we need to send anywhere.  She should have copy through my chart   thanks

## 2020-01-03 NOTE — Telephone Encounter (Signed)
Left message informing patient of note on mychart.

## 2020-01-06 NOTE — Telephone Encounter (Signed)
Left message - Mychart message sent.

## 2020-01-07 ENCOUNTER — Institutional Professional Consult (permissible substitution): Payer: 59 | Admitting: Internal Medicine

## 2020-01-10 ENCOUNTER — Telehealth: Payer: Self-pay | Admitting: Internal Medicine

## 2020-01-10 NOTE — Telephone Encounter (Signed)
error 

## 2020-01-11 NOTE — Assessment & Plan Note (Signed)
Previous CT chest - no evidence of PE.  Small scattered nodules measuring less then 22mm.  Discuss with Dr Thelma Barge - to review CT.

## 2020-01-11 NOTE — Assessment & Plan Note (Signed)
Diagnosed with covid.  Symptoms improved.  No increased cough, congestion or sob.  Eating.  Still dealing with fatigue.  Unable to return to work this week.  Discussed plans to return to work next week 1/2 days and then full days starting 01/13/20.  Letter provided.

## 2020-01-14 DIAGNOSIS — Z0279 Encounter for issue of other medical certificate: Secondary | ICD-10-CM

## 2020-01-14 NOTE — Telephone Encounter (Signed)
Paperwork placed in quick sign for completion- most of questions have already been answered and charge form attached.

## 2020-01-15 NOTE — Telephone Encounter (Signed)
Faxed to [employer]

## 2020-01-15 NOTE — Telephone Encounter (Signed)
Signed and placed in box.   

## 2020-01-17 ENCOUNTER — Encounter: Payer: Self-pay | Admitting: Internal Medicine

## 2020-01-17 ENCOUNTER — Other Ambulatory Visit
Admission: RE | Admit: 2020-01-17 | Discharge: 2020-01-17 | Disposition: A | Discharge status: Home / Self Care | Payer: Managed Care, Other (non HMO) | Source: Ambulatory Visit | Attending: Internal Medicine | Admitting: Internal Medicine

## 2020-01-17 ENCOUNTER — Other Ambulatory Visit: Payer: Self-pay

## 2020-01-17 ENCOUNTER — Ambulatory Visit (INDEPENDENT_AMBULATORY_CARE_PROVIDER_SITE_OTHER): Payer: Managed Care, Other (non HMO) | Admitting: Internal Medicine

## 2020-01-17 VITALS — BP 120/78 | HR 75 | Temp 97.1°F | Ht 64.0 in | Wt 169.8 lb

## 2020-01-17 DIAGNOSIS — Z8616 Personal history of COVID-19: Secondary | ICD-10-CM | POA: Insufficient documentation

## 2020-01-17 DIAGNOSIS — R918 Other nonspecific abnormal finding of lung field: Secondary | ICD-10-CM

## 2020-01-17 DIAGNOSIS — R0600 Dyspnea, unspecified: Secondary | ICD-10-CM

## 2020-01-17 DIAGNOSIS — R0609 Other forms of dyspnea: Secondary | ICD-10-CM

## 2020-01-17 LAB — CBC
HCT: 37.2 % (ref 36.0–46.0)
Hemoglobin: 12.3 g/dL (ref 12.0–15.0)
MCH: 30.3 pg (ref 26.0–34.0)
MCHC: 33.1 g/dL (ref 30.0–36.0)
MCV: 91.6 fL (ref 80.0–100.0)
Platelets: 216 10*3/uL (ref 150–400)
RBC: 4.06 MIL/uL (ref 3.87–5.11)
RDW: 12.8 % (ref 11.5–15.5)
WBC: 8.4 10*3/uL (ref 4.0–10.5)
nRBC: 0 % (ref 0.0–0.2)

## 2020-01-17 NOTE — Progress Notes (Signed)
OV 01/17/2020  Subjective:  Patient ID: Kimberly Madden, female , DOB: 1961/03/23 , age 59 y.o. , MRN: [id number] , ADDRESS: [address] [place] [address]   01/17/2020 -   Chief Complaint  Patient presents with  . Pulmonary Consult    Referred by Dr. Lorin Picket for post Covid sob. Patient reports sob with exertion and if she sits too long its hard to take in a deep breath.      HPI Kimberly Madden 59 y.o. -new consultation for post Covid shortness of breath.  59 year old previously healthy.  She says that she has a history of longevity in her family with several members living past 60 years of age.  She says only medical issues she is dealt with this with hepatitis C but she is quite functional.  In mid January 2021 she visited some family.  She was masked but did not mask when she had lunch with them.  After that she got Covid.  The other family member who she visited passed away from Covid.  She says she was not hospitalized with Covid but was quite fatigued.  Since then she has had shortness of breath and cough.  The cough is almost resolved.  The fatigue is almost resolved with the shortness of breath is persisting after initial improvement.  Symptom severity is listed below.  Also in the last 4 to 6-week she has noticed on and off heartburn she is not taking any medicine for this.  She had an EKG in mid February 2021 that I personally visualized and interpreted it is normal.  Also in mid February she had a CT angiogram chest that ruled out pulmonary embolism.  She has some multiple nodules the largest is 5 mm.  She is a non-smoker.  There is no family history of any cardiac disease.  Review of the labs indicate she might be mildly anemic from a few years ago but most recently renal function was normal.  She is not had an echocardiogram.  Of note, sometimes she is describing the heartburn as a chest pain which was one episode when she was at rest and was transient in a few seconds.  Walking  desaturation test today is completely normal  SYMPTOM SCALE - ILD 01/17/2020   O2 use ra  Shortness of Breath 0 -> 5 scale with 5 being worst (score 6 If unable to do)  At rest 2  Simple tasks - showers, clothes change, eating, shaving 2  Household (dishes, doing bed, laundry) 4  Shopping 4  Walking level at own pace 2  Walking up Stairs x  Total (30-36) Dyspnea Score 14  How bad is your cough? littl cough  How bad is your fatigue 0  How bad is nausea 0  How bad is vomiting?  0  How bad is diarrhea? 0  How bad is anxiety? some  How bad is depression little       Simple office walk 185 feet x  3 laps goal with forehead probe 01/17/2020   O2 used ra  Number laps completed 3  Comments about pace avg  Resting Pulse Ox/HR 100% and 65/min  Final Pulse Ox/HR 100% and 80/min  Desaturated </= 88% no  Desaturated <= 3% points no  Got Tachycardic >/= 90/min no  Symptoms at end of test none  Miscellaneous comments x    Results for JERIE, BASFORD (MRN [id number]) as of 01/17/2020 12:15  Ref. Range 12/14/2019 12:21  Creatinine Latest  Ref Range: 0.44 - 1.00 mg/dL 2.99   Results for ADYN, HOES (MRN [id number]) as of 01/17/2020 12:15  Ref. Range 08/21/2018 07:53  Hemoglobin Latest Ref Range: 11.1 - 15.9 g/dL 62.2    IMPRESSION: CT chst fevb 2012 No evidence of pulmonary emboli.  Scattered small nodules measuring less than 5 mm. No follow-up needed if patient is low-risk (and has no known or suspected primary neoplasm). Non-contrast chest CT can be considered in 12 months if patient is high-risk. This recommendation follows the consensus statement: Guidelines for Management of Incidental Pulmonary Nodules Detected on CT Images: From the Fleischner Society 2017; Radiology 2017; 284:228-243.  Aortic Atherosclerosis (ICD10-I70.0).   Electronically Signed   By: Alcide Clever M.D.   On: 12/14/2019 14:38   ROS - per HPI     has a past medical history of Hepatitis  C.   reports that she quit smoking about 36 years ago. Her smoking use included cigarettes. She has a 0.75 pack-year smoking history. She has never used smokeless tobacco.  Past Surgical History:  Procedure Laterality Date  . BREAST BIOPSY Left 2006   neg  . BREAST SURGERY Left    benign biopsy    No Known Allergies  Immunization History  Administered Date(s) Administered  . Hep A / Hep B 02/17/2015, 03/19/2015  . Influenza,inj,Quad PF,6+ Mos 07/28/2017  . Influenza-Unspecified 09/15/2019  . Tdap 06/12/2014  . Zoster Recombinat (Shingrix) 09/15/2019    Family History  Problem Relation Age of Onset  . Cancer Paternal Aunt 75       breast cancer - died  . Cancer Paternal Aunt 80       lung cancer - smoker - died  . Diabetes Paternal Aunt   . Colon cancer Neg Hx   . Stomach cancer Neg Hx   . Breast cancer Neg Hx      Current Outpatient Medications:  .  Acetaminophen (TYLENOL PO), Take by mouth as needed., Disp: , Rfl:  .  albuterol (VENTOLIN HFA) 108 (90 Base) MCG/ACT inhaler, Inhale 2 puffs into the lungs every 4 (four) hours as needed. , Disp: , Rfl:  .  triamcinolone (NASACORT) 55 MCG/ACT AERO nasal inhaler, Place 2 sprays into the nose daily. Do this in the evening, Disp: 1 Inhaler, Rfl: 0      Objective:   Vitals:   01/17/20 1201  BP: 120/78  Pulse: 75  Temp: (!) 97.1 F (36.2 C)  TempSrc: Temporal  SpO2: 99%  Weight: 169 lb 12.8 oz (77 kg)  Height: 5\' 4"  (1.626 m)    Estimated body mass index is 29.15 kg/m as calculated from the following:   Height as of this encounter: 5\' 4"  (1.626 m).   Weight as of this encounter: 169 lb 12.8 oz (77 kg).  @WEIGHTCHANGE @    01/17/20 1201  Weight: 169 lb 12.8 oz (77 kg)     Physical Exam  General Appearance:    Alert, cooperative, no distress, appears stated age - yes , Deconditioned looking - no , OBESE  - no, Sitting on Wheelchair -  no  Head:    Normocephalic, without obvious abnormality,  atraumatic  Eyes:    PERRL, conjunctiva/corneas clear,  Ears:    Normal TM's and external ear canals, both ears  Nose:   Nares normal, septum midline, mucosa normal, no drainage    or sinus tenderness. OXYGEN ON  - no . Patient is @ ra   Throat:   Lips, mucosa, and  tongue normal; teeth and gums normal. Cyanosis on lips - no  Neck:   Supple, symmetrical, trachea midline, no adenopathy;    thyroid:  no enlargement/tenderness/nodules; no carotid   bruit or JVD  Back:     Symmetric, no curvature, ROM normal, no CVA tenderness  Lungs:     Distress - no , Wheeze no, Barrell Chest - no, Purse lip breathing - no, Crackles - no   Chest Wall:    No tenderness or deformity.    Heart:    Regular rate and rhythm, S1 and S2 normal, no rub   or gallop, Murmur - no  Breast Exam:    NOT DONE  Abdomen:     Soft, non-tender, bowel sounds active all four quadrants,    no masses, no organomegaly. Visceral obesity - no  Genitalia:   NOT DONE  Rectal:   NOT DONE  Extremities:   Extremities - normal, Has Cane - no, Clubbing - no, Edema - no  Pulses:   2+ and symmetric all extremities  Skin:   Stigmata of Connective Tissue Disease - no  Lymph nodes:   Cervical, supraclavicular, and axillary nodes normal  Psychiatric:  Neurologic:   Pleasant - yds, Anxious - no, Flat affect - no  CAm-ICU - neg, Alert and Oriented x 3 - yes, Moves all 4s - yes, Speech - normal, Cognition - intact           Assessment:       ICD-10-CM   1. Personal history of covid-19  Z86.16 CBC    ECHOCARDIOGRAM COMPLETE  2. Dyspnea on exertion  R06.00 CBC    ECHOCARDIOGRAM COMPLETE  3. Multiple nodules of lung  R91.8        Plan:     Patient Instructions     ICD-10-CM   1. Personal history of covid-19  Z86.16   2. Dyspnea on exertion  R06.00   3. Multiple nodules of lung  R91.8    Personal history of covid-19 Dyspnea on exertion  -Most likely this is just standard post viral shortness of breath that requires pulmonary  rehabilitation to get better. -In the setting of Covid there is 90% recovery rate in 3-6 months from the time of Covid -To be on the safe side will make sure hemoglobin and cardiac function is normal  Plan  -Check CBC -Check echocardiogram -If these are normal will refer to pulmonary rehabilitation  Heartburn  -Most likely this is acid reflux but in female it can occasionally be atypical symptoms of cardiac pain -EKG mid February 2021 was normal  Plan   -Take over-the-counter Zegerid 20 mg daily on empty stomach for 1 month -However please do talk to primary care physician as soon as you can about ongoing heartburn and if any further work-up is needed.  Multiple nodules of lung -largest 5 mm on CT chest February 2021  Plan -Repeat CT chest without contrast in 1 year which will be February 2022  Follow-up -15-minute telephone visit with nurse practitioner to review CBC and echocardiogram results -and to take the next step of possible referral to pulmonary rehabilitation  -Do this appointment in the next 1 or 2 weeks.  -The nurse practitioner for the telephone visit can be in [facility]    Dr. Brand Males, M.D., F.C.C.P,  Pulmonary and Critical Care Medicine Staff Physician, [facility] Director - Interstitial Lung Disease  Program  [facility] -  [facility] at [facility] [place], [place], [address]  Pager: [phone], If no answer or between  15:00h - 7:00h: call [phone] Telephone: [phone]  12:39 PM 01/17/2020

## 2020-01-17 NOTE — Patient Instructions (Addendum)
ICD-10-CM   1. Personal history of covid-19  Z86.16   2. Dyspnea on exertion  R06.00   3. Multiple nodules of lung  R91.8    Personal history of covid-19 Dyspnea on exertion  -Most likely this is just standard post viral shortness of breath that requires pulmonary rehabilitation to get better. -In the setting of Covid there is 90% recovery rate in 3-6 months from the time of Covid -To be on the safe side will make sure hemoglobin and cardiac function is normal  Plan  -Check CBC -Check echocardiogram -If these are normal will refer to pulmonary rehabilitation  Heartburn  -Most likely this is acid reflux but in female it can occasionally be atypical symptoms of cardiac pain -EKG mid February 2021 was normal  Plan   -Take over-the-counter Zegerid 20 mg daily on empty stomach for 1 month -However please do talk to primary care physician as soon as you can about ongoing heartburn and if any further work-up is needed.  Multiple nodules of lung -largest 5 mm on CT chest February 2021  Plan -Repeat CT chest without contrast in 1 year which will be February 2022  Follow-up -15-minute telephone visit with nurse practitioner to review CBC and echocardiogram results -and to take the next step of possible referral to pulmonary rehabilitation  -Do this appointment in the next 1 or 2 weeks.  -The nurse practitioner for the telephone visit can be in [facility]

## 2020-01-18 ENCOUNTER — Telehealth: Payer: Self-pay | Admitting: Internal Medicine

## 2020-01-18 NOTE — Telephone Encounter (Signed)
Pt saw pulmonary.  They recommended f/u CT chest in one year and are following.

## 2020-01-18 NOTE — Telephone Encounter (Signed)
-----   Message from Kimberly Ply, RN sent at 01/13/2020 10:50 AM EDT ----- Regarding: RE: review chest CT He said he would recommend 12 month follow up CT scan. If you need him to see her, he is happy to.  Thanks, Shawn ----- Message ----- From: Dale Croom, MD Sent: 01/11/2020   9:13 PM EDT To: Kimberly Ply, RN Subject: review chest CT                                Kimberly Madden had COVID.  CT chest with small nodules.  Do you mind having Dr Thelma Barge review and let me know when he would recommend follow up?  Thank you for your help.    [name] you are doing well.   Dale [name]

## 2020-01-31 ENCOUNTER — Ambulatory Visit: Payer: Managed Care, Other (non HMO)

## 2020-02-21 ENCOUNTER — Ambulatory Visit: Admission: RE | Admit: 2020-02-21 | Payer: Managed Care, Other (non HMO) | Source: Ambulatory Visit

## 2020-02-28 ENCOUNTER — Ambulatory Visit: Payer: Managed Care, Other (non HMO) | Admitting: Primary Care

## 2020-08-17 ENCOUNTER — Telehealth: Payer: Self-pay

## 2020-08-17 NOTE — Telephone Encounter (Signed)
Pt would like a call back-did not disclose what all was needed

## 2020-08-18 NOTE — Telephone Encounter (Signed)
Unable to leave message

## 2020-08-19 NOTE — Telephone Encounter (Signed)
Pt called back wanting to know if we received a CPE form from [employer]

## 2020-08-19 NOTE — Telephone Encounter (Signed)
Advised that we did receive form but unable to read. She is going to resend.

## 2020-12-03 ENCOUNTER — Encounter: Payer: Self-pay | Admitting: *Deleted

## 2021-02-03 ENCOUNTER — Telehealth: Payer: Self-pay

## 2021-02-03 NOTE — Telephone Encounter (Signed)
Pt needs labs ordered for [facility]-she is an employee

## 2021-02-05 NOTE — Telephone Encounter (Signed)
Called patient to clarify. Mailbox is full.

## 2021-02-08 ENCOUNTER — Ambulatory Visit (INDEPENDENT_AMBULATORY_CARE_PROVIDER_SITE_OTHER): Payer: Managed Care, Other (non HMO) | Admitting: Internal Medicine

## 2021-02-08 ENCOUNTER — Encounter: Payer: Self-pay | Admitting: Internal Medicine

## 2021-02-08 ENCOUNTER — Other Ambulatory Visit: Payer: Self-pay | Admitting: Internal Medicine

## 2021-02-08 ENCOUNTER — Other Ambulatory Visit: Payer: Self-pay

## 2021-02-08 VITALS — BP 122/70 | HR 76 | Temp 97.8°F | Resp 16 | Ht 64.0 in | Wt 167.4 lb

## 2021-02-08 DIAGNOSIS — Z1211 Encounter for screening for malignant neoplasm of colon: Secondary | ICD-10-CM

## 2021-02-08 DIAGNOSIS — Z1231 Encounter for screening mammogram for malignant neoplasm of breast: Secondary | ICD-10-CM | POA: Diagnosis not present

## 2021-02-08 DIAGNOSIS — E559 Vitamin D deficiency, unspecified: Secondary | ICD-10-CM

## 2021-02-08 DIAGNOSIS — Z0000 Encounter for general adult medical examination without abnormal findings: Secondary | ICD-10-CM

## 2021-02-08 DIAGNOSIS — F439 Reaction to severe stress, unspecified: Secondary | ICD-10-CM

## 2021-02-08 DIAGNOSIS — Z13220 Encounter for screening for lipoid disorders: Secondary | ICD-10-CM

## 2021-02-08 DIAGNOSIS — Z124 Encounter for screening for malignant neoplasm of cervix: Secondary | ICD-10-CM

## 2021-02-08 DIAGNOSIS — R918 Other nonspecific abnormal finding of lung field: Secondary | ICD-10-CM

## 2021-02-08 DIAGNOSIS — I10 Essential (primary) hypertension: Secondary | ICD-10-CM

## 2021-02-08 DIAGNOSIS — B182 Chronic viral hepatitis C: Secondary | ICD-10-CM

## 2021-02-08 NOTE — Patient Instructions (Signed)
eucerin cream or sarna lotion.

## 2021-02-08 NOTE — Progress Notes (Signed)
Patient ID: Kimberly Madden, female   DOB: June 05, 1961, 60 y.o.   MRN: [id number]   Subjective:    Patient ID: Kimberly Madden, female    DOB: 11-Feb-1961, 60 y.o.   MRN: [id number]  HPI This visit occurred during the SARS-CoV-2 public health emergency.  Safety protocols were in place, including screening questions prior to the visit, additional usage of staff PPE, and extensive cleaning of exam room while observing appropriate contact time as indicated for disinfecting solutions.  Patient here for her physical exam.  Had covid 2021. Has noticed - itching - skin.  No rash.  Discussed dry skin.  Tries to stay active.  Walking.  No chest pain reported.  No abdominal pain or bowel change reported.  Had covid 11/2019.  Had persistent sob post covid.  Saw pulmonary.  CT chest - multiple nodules of lung - largest 9mm on CT 12/2019.  Recommended f/u chest CT in one year. Breathing appears to be stable.  No persistent cough/congestion.  Handling stress.    Past Medical History:  Diagnosis Date  . Hepatitis C    Past Surgical History:  Procedure Laterality Date  . BREAST BIOPSY Left 2006   neg  . BREAST SURGERY Left    benign biopsy   Family History  Problem Relation Age of Onset  . Cancer Paternal Aunt 69       breast cancer - died  . Cancer Paternal Aunt 72       lung cancer - smoker - died  . Diabetes Paternal Aunt   . Colon cancer Neg Hx   . Stomach cancer Neg Hx   . Breast cancer Neg Hx    Social History   Socioeconomic History  . Marital status: Married    Spouse name: Smitty Cords  . Number of children: 1  . Years of education: 18  . Highest education level: Not on file  Occupational History  . Occupation: Nutritional therapist: [employer]  Tobacco Use  . Smoking status: Former Smoker    Packs/day: 0.25    Years: 3.00    Pack years: 0.75    Types: Cigarettes    Quit date: 08/01/1983    Years since quitting: 37.5  . Smokeless tobacco: Never Used  Substance and Sexual Activity  .  Alcohol use: Yes  . Drug use: No  . Sexual activity: Not on file  Other Topics Concern  . Not on file  Social History Narrative   Tyrene grew up in [place], [place]. She lives at home with her husband, Smitty Cords, of 20 years. Marny has a daughter, Drema Pry, who lives in [place]. Yeily and her husband have a dog named Will. She enjoys traveling and gardening. She has a vegetable garden that she tends to. She also enjoys spending time with friends and family. 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   Social Determinants of Health   Financial Resource Strain: Not on file  Food Insecurity: Not on file  Transportation Needs: Not on file  Physical Activity: Not on file  Stress: Not on file  Social Connections: Not on file    Outpatient Encounter Medications as of 02/08/2021  Medication Sig  . Acetaminophen (TYLENOL PO) Take by mouth as needed.  . triamcinolone (NASACORT) 55 MCG/ACT AERO nasal inhaler Place 2 sprays into the nose daily. Do this in the evening  . albuterol (VENTOLIN HFA) 108 (90 Base) MCG/ACT inhaler Inhale 2 puffs into the lungs every 4 (four) hours as  needed.    No facility-administered encounter medications on file as of 02/08/2021.    Review of Systems  Constitutional: Negative for appetite change and unexpected weight change.  HENT: Negative for congestion, sinus pressure and sore throat.   Eyes: Negative for pain and visual disturbance.  Respiratory: Negative for cough, chest tightness and shortness of breath.   Cardiovascular: Negative for chest pain, palpitations and leg swelling.  Gastrointestinal: Negative for abdominal pain, diarrhea, nausea and vomiting.  Genitourinary: Negative for difficulty urinating and dysuria.  Musculoskeletal: Negative for joint swelling and myalgias.  Skin: Negative for color change and rash.  Neurological: Negative for dizziness, light-headedness and headaches.  Hematological: Negative for adenopathy. Does not  bruise/bleed easily.  Psychiatric/Behavioral: Negative for agitation and dysphoric mood.       Objective:    Physical Exam Vitals reviewed.  Constitutional:      General: She is not in acute distress.    Appearance: Normal appearance. She is well-developed.  HENT:     Head: Normocephalic and atraumatic.     Right Ear: External ear normal.     Left Ear: External ear normal.  Eyes:     General: No scleral icterus.       Right eye: No discharge.        Left eye: No discharge.     Conjunctiva/sclera: Conjunctivae normal.  Neck:     Thyroid: No thyromegaly.  Cardiovascular:     Rate and Rhythm: Normal rate and regular rhythm.  Pulmonary:     Effort: No tachypnea, accessory muscle usage or respiratory distress.     Breath sounds: Normal breath sounds. No decreased breath sounds or wheezing.  Chest:  Breasts:     Right: No inverted nipple, mass, nipple discharge or tenderness (no axillary adenopathy).     Left: No inverted nipple, mass, nipple discharge or tenderness (no axilarry adenopathy).    Abdominal:     General: Bowel sounds are normal.     Palpations: Abdomen is soft.     Tenderness: There is no abdominal tenderness.  Genitourinary:    Comments: Normal external genitalia.  Vaginal vault without lesions.  Cervix identified.  Pap smear performed.  Could not appreciate any adnexal masses or tenderness.   Musculoskeletal:        General: No swelling or tenderness.     Cervical back: Neck supple. No tenderness.  Lymphadenopathy:     Cervical: No cervical adenopathy.  Skin:    Findings: No erythema or rash.  Neurological:     Mental Status: She is alert and oriented to person, place, and time.  Psychiatric:        Mood and Affect: Mood normal.        Behavior: Behavior normal.     BP 122/70   Pulse 76   Temp 97.8 F (36.6 C) (Oral)   Resp 16   Ht 5\' 4"  (1.626 m)   Wt 167 lb 6.4 oz (75.9 kg)   LMP 11/06/2012   SpO2 99%   BMI 28.73 kg/m  Wt Readings from  Last 3 Encounters:  02/08/21 167 lb 6.4 oz (75.9 kg)  01/17/20 169 lb 12.8 oz (77 kg)  01/03/20 157 lb (71.2 kg)     Lab Results  Component Value Date   WBC 7.0 02/08/2021   HGB 12.3 02/08/2021   HCT 36.8 02/08/2021   PLT 215 02/08/2021   GLUCOSE 90 02/08/2021   CHOL 197 02/08/2021   TRIG 94 02/08/2021   HDL 52  02/08/2021   LDLCALC 128 (H) 02/08/2021   ALT 8 02/08/2021   AST 18 02/08/2021   NA 142 02/08/2021   K 4.0 02/08/2021   CL 104 02/08/2021   CREATININE 0.86 02/08/2021   BUN 13 02/08/2021   CO2 20 02/08/2021   TSH 3.400 02/08/2021       Assessment & Plan:   Problem List Items Addressed This Visit    Colon cancer screening    Colonoscopy 2015.  IFOB.       Relevant Orders   Fecal occult blood, imunochemical   Essential hypertension   Relevant Orders   Basic metabolic panel (Completed)   CBC with Differential/Platelet (Completed)   Hepatic function panel (Completed)   TSH (Completed)   Healthcare maintenance    Physical today 02/08/21.  PAP 02/08/21.  Needs mammogram - overdue.  Colonoscopy 2015.  IFOB.       Hepatitis C without hepatic coma    S/p treatment with Harvoni.  Previously followed by GI.  Follow liver function tests.        Pulmonary nodules    Saw pulmonary 12/2019.  Had CT 12/2019 as outlined.  Recommended f/u chest CT 12/2020.  Need to confirm has f/u.       Stress    Overall appears to be doing relatively well.  Follow.       Vitamin D deficiency   Relevant Orders   VITAMIN D 25 Hydroxy (Vit-D Deficiency, Fractures) (Completed)    Other Visit Diagnoses    Routine general medical examination at a health care facility    -  Primary   Visit for screening mammogram       Relevant Orders   MM 3D SCREEN BREAST BILATERAL   Screening cholesterol level       Relevant Orders   Lipid panel (Completed)   Cervical cancer screening       Relevant Orders   Cytology - PAP( [place])       Dale Lawson, MD

## 2021-02-09 LAB — BASIC METABOLIC PANEL
BUN/Creatinine Ratio: 15 (ref 12–28)
BUN: 13 mg/dL (ref 8–27)
CO2: 20 mmol/L (ref 20–29)
Calcium: 9.6 mg/dL (ref 8.7–10.3)
Chloride: 104 mmol/L (ref 96–106)
Creatinine, Ser: 0.86 mg/dL (ref 0.57–1.00)
Glucose: 90 mg/dL (ref 65–99)
Potassium: 4 mmol/L (ref 3.5–5.2)
Sodium: 142 mmol/L (ref 134–144)
eGFR: 77 mL/min/{1.73_m2} (ref >59)

## 2021-02-09 LAB — CBC WITH DIFFERENTIAL/PLATELET
Basophils Absolute: 0 10*3/uL (ref 0.0–0.2)
Basos: 0 %
EOS (ABSOLUTE): 0.1 10*3/uL (ref 0.0–0.4)
Eos: 2 %
Hematocrit: 36.8 % (ref 34.0–46.6)
Hemoglobin: 12.3 g/dL (ref 11.1–15.9)
Immature Grans (Abs): 0 10*3/uL (ref 0.0–0.1)
Immature Granulocytes: 0 %
Lymphocytes Absolute: 3 10*3/uL (ref 0.7–3.1)
Lymphs: 44 %
MCH: 30.1 pg (ref 26.6–33.0)
MCHC: 33.4 g/dL (ref 31.5–35.7)
MCV: 90 fL (ref 79–97)
Monocytes Absolute: 0.8 10*3/uL (ref 0.1–0.9)
Monocytes: 11 %
Neutrophils Absolute: 3 10*3/uL (ref 1.4–7.0)
Neutrophils: 43 %
Platelets: 215 10*3/uL (ref 150–450)
RBC: 4.08 x10E6/uL (ref 3.77–5.28)
RDW: 12.4 % (ref 11.7–15.4)
WBC: 7 10*3/uL (ref 3.4–10.8)

## 2021-02-09 LAB — TSH: TSH: 3.4 u[IU]/mL (ref 0.450–4.500)

## 2021-02-09 LAB — HEPATIC FUNCTION PANEL
ALT: 8 IU/L (ref 0–32)
AST: 18 IU/L (ref 0–40)
Albumin: 4.6 g/dL (ref 3.8–4.9)
Alkaline Phosphatase: 59 IU/L (ref 44–121)
Bilirubin Total: 0.4 mg/dL (ref 0.0–1.2)
Bilirubin, Direct: 0.1 mg/dL (ref 0.00–0.40)
Total Protein: 7.6 g/dL (ref 6.0–8.5)

## 2021-02-09 LAB — LIPID PANEL
Chol/HDL Ratio: 3.8 ratio (ref 0.0–4.4)
Cholesterol, Total: 197 mg/dL (ref 100–199)
HDL: 52 mg/dL (ref >39)
LDL Chol Calc (NIH): 128 mg/dL — ABNORMAL HIGH (ref 0–99)
Triglycerides: 94 mg/dL (ref 0–149)
VLDL Cholesterol Cal: 17 mg/dL (ref 5–40)

## 2021-02-09 LAB — VITAMIN D 25 HYDROXY (VIT D DEFICIENCY, FRACTURES): Vit D, 25-Hydroxy: 24.4 ng/mL — ABNORMAL LOW (ref 30.0–100.0)

## 2021-02-15 ENCOUNTER — Encounter: Payer: Self-pay | Admitting: Internal Medicine

## 2021-02-15 LAB — IGP, APTIMA HPV: HPV Aptima: NEGATIVE

## 2021-02-15 NOTE — Assessment & Plan Note (Signed)
Physical today 02/08/21.  PAP 02/08/21.  Needs mammogram - overdue.  Colonoscopy 2015.  IFOB.

## 2021-02-15 NOTE — Assessment & Plan Note (Signed)
Saw pulmonary 12/2019.  Had CT 12/2019 as outlined.  Recommended f/u chest CT 12/2020.  Need to confirm has f/u.

## 2021-02-15 NOTE — Assessment & Plan Note (Signed)
S/p treatment with Harvoni.  Previously followed by GI.  Follow liver function tests.   °

## 2021-02-15 NOTE — Assessment & Plan Note (Signed)
Colonoscopy 2015.  IFOB.

## 2021-02-15 NOTE — Assessment & Plan Note (Signed)
Overall appears to be doing relatively well.  Follow.   

## 2021-03-09 ENCOUNTER — Other Ambulatory Visit (INDEPENDENT_AMBULATORY_CARE_PROVIDER_SITE_OTHER): Payer: Managed Care, Other (non HMO)

## 2021-03-09 DIAGNOSIS — Z1211 Encounter for screening for malignant neoplasm of colon: Secondary | ICD-10-CM

## 2021-03-09 LAB — FECAL OCCULT BLOOD, IMMUNOCHEMICAL: Fecal Occult Bld: NEGATIVE

## 2021-04-15 ENCOUNTER — Ambulatory Visit: Payer: Managed Care, Other (non HMO) | Admitting: Internal Medicine

## 2021-04-16 ENCOUNTER — Ambulatory Visit: Payer: Managed Care, Other (non HMO) | Admitting: Internal Medicine

## 2021-07-16 ENCOUNTER — Ambulatory Visit
Admission: RE | Admit: 2021-07-16 | Discharge: 2021-07-16 | Disposition: A | Discharge status: Home / Self Care | Payer: Managed Care, Other (non HMO) | Source: Ambulatory Visit | Attending: Internal Medicine | Admitting: Internal Medicine

## 2021-07-16 ENCOUNTER — Other Ambulatory Visit: Payer: Self-pay

## 2021-07-16 DIAGNOSIS — Z1231 Encounter for screening mammogram for malignant neoplasm of breast: Secondary | ICD-10-CM | POA: Diagnosis present

## 2021-08-09 ENCOUNTER — Encounter: Payer: Self-pay | Admitting: Internal Medicine

## 2021-08-10 NOTE — Telephone Encounter (Signed)
LMTCB

## 2021-09-01 ENCOUNTER — Telehealth: Payer: Self-pay | Admitting: Internal Medicine

## 2021-09-01 ENCOUNTER — Other Ambulatory Visit: Payer: Self-pay

## 2021-09-01 ENCOUNTER — Ambulatory Visit (INDEPENDENT_AMBULATORY_CARE_PROVIDER_SITE_OTHER): Payer: Managed Care, Other (non HMO) | Admitting: Internal Medicine

## 2021-09-01 ENCOUNTER — Encounter: Payer: Self-pay | Admitting: Internal Medicine

## 2021-09-01 VITALS — BP 168/80 | HR 68 | Temp 98.3°F | Resp 16 | Ht 64.0 in | Wt 168.2 lb

## 2021-09-01 DIAGNOSIS — Z23 Encounter for immunization: Secondary | ICD-10-CM

## 2021-09-01 DIAGNOSIS — N898 Other specified noninflammatory disorders of vagina: Secondary | ICD-10-CM | POA: Diagnosis not present

## 2021-09-01 DIAGNOSIS — I10 Essential (primary) hypertension: Secondary | ICD-10-CM

## 2021-09-01 DIAGNOSIS — R918 Other nonspecific abnormal finding of lung field: Secondary | ICD-10-CM

## 2021-09-01 DIAGNOSIS — R21 Rash and other nonspecific skin eruption: Secondary | ICD-10-CM

## 2021-09-01 DIAGNOSIS — R739 Hyperglycemia, unspecified: Secondary | ICD-10-CM

## 2021-09-01 DIAGNOSIS — Z8639 Personal history of other endocrine, nutritional and metabolic disease: Secondary | ICD-10-CM

## 2021-09-01 MED ORDER — AMLODIPINE BESYLATE 5 MG PO TABS: 5.0000 mg | ORAL_TABLET | Freq: Every day | ORAL | 2 refills | Status: DC

## 2021-09-01 MED ORDER — NYSTATIN 100000 UNIT/GM EX CREA: 1.0000 "application " | TOPICAL_CREAM | Freq: Two times a day (BID) | CUTANEOUS | 0 refills | Status: DC

## 2021-09-01 NOTE — Telephone Encounter (Signed)
Patient was seen in office on 09/01/21 and was told to schedule fasting labs within the next two to three weeks. Pt is requesting lab order be sent to [facility] where she will complete.   Callback number is [phone]

## 2021-09-01 NOTE — Progress Notes (Signed)
Patient ID: Jacarra Bobak, female   DOB: 02/03/1961, 60 y.o.   MRN: [id number]   Subjective:    Patient ID: Moncia Annas, female    DOB: 04/19/1961, 60 y.o.   MRN: [id number]  This visit occurred during the SARS-CoV-2 public health emergency.  Safety protocols were in place, including screening questions prior to the visit, additional usage of staff PPE, and extensive cleaning of exam room while observing appropriate contact time as indicated for disinfecting solutions.   Patient here for work in appt.    Chief Complaint  Patient presents with   Rash   .   HPI Reports vaginal swelling/itching/rash.  Present over the last several weeks.  Describes burning and itching.  Has used triple abx cream, vasoline with aloe and neosporin.  Has helped.  No new contacts - soaps/detergents, etc.  No fever.  Also reports a separate issue - itching and hives.  Takes benadryl and zyrtec.  Has been a persistent, intermittent problem for.  No known exposures or contacts.  Discussed referral to an allergist.  No cough or congestion.  No sob.  Eating.  No nausea or vomiting reported.     Past Medical History:  Diagnosis Date   Hepatitis C    Past Surgical History:  Procedure Laterality Date   BREAST BIOPSY Left 2006   neg   BREAST SURGERY Left    benign biopsy   Family History  Problem Relation Age of Onset   Cancer Paternal Aunt 80       breast cancer - died   Cancer Paternal Aunt 59       lung cancer - smoker - died   Diabetes Paternal Aunt    Colon cancer Neg Hx    Stomach cancer Neg Hx    Breast cancer Neg Hx    Social History   Socioeconomic History   Marital status: Married    Spouse name: Bruce   Number of children: 1   Years of education: 12   Highest education level: Not on file  Occupational History   Occupation: Nutritional therapist: [employer]  Tobacco Use   Smoking status: Former    Packs/day: 0.25    Years: 3.00    Pack years: 0.75    Types: Cigarettes    Quit  date: 08/01/1983    Years since quitting: 38.1   Smokeless tobacco: Never  Substance and Sexual Activity   Alcohol use: Yes   Drug use: No   Sexual activity: Not on file  Other Topics Concern   Not on file  Social History Narrative   Lourdes grew up in [place], [place]. She lives at home with her husband, Smitty Cords, of 20 years. Carine has a daughter, Drema Pry, who lives in [place]. Penne and her husband have a dog named Will. She enjoys traveling and gardening. She has a vegetable garden that she tends to. She also enjoys spending time with friends and family. 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   Social Determinants of Health   Financial Resource Strain: Not on file  Food Insecurity: Not on file  Transportation Needs: Not on file  Physical Activity: Not on file  Stress: Not on file  Social Connections: Not on file     Review of Systems  Constitutional:  Negative for appetite change and fever.  HENT:  Negative for congestion and sinus pressure.   Respiratory:  Negative for cough, chest tightness and shortness of breath.   Cardiovascular:  Negative for chest pain and leg swelling.  Gastrointestinal:  Negative for abdominal pain, diarrhea, nausea and vomiting.  Genitourinary:  Negative for difficulty urinating and dysuria.  Musculoskeletal:  Negative for joint swelling and myalgias.  Skin:  Positive for rash.  Neurological:  Negative for dizziness, light-headedness and headaches.  Psychiatric/Behavioral:  Negative for agitation and dysphoric mood.       Objective:     BP (!) 168/80   Pulse 68   Temp 98.3 F (36.8 C) (Oral)   Resp 16   Ht 5\' 4"  (1.626 m)   Wt 168 lb 3 oz (76.3 kg)   LMP 11/06/2012   SpO2 99%   BMI 28.87 kg/m  Wt Readings from Last 3 Encounters:  09/01/21 168 lb 3 oz (76.3 kg)  02/08/21 167 lb 6.4 oz (75.9 kg)  01/17/20 169 lb 12.8 oz (77 kg)    Physical Exam Vitals reviewed.  Constitutional:      General: She is not in  acute distress.    Appearance: Normal appearance.  HENT:     Head: Normocephalic and atraumatic.     Right Ear: External ear normal.     Left Ear: External ear normal.  Eyes:     General: No scleral icterus.       Right eye: No discharge.        Left eye: No discharge.     Conjunctiva/sclera: Conjunctivae normal.  Neck:     Thyroid: No thyromegaly.  Cardiovascular:     Rate and Rhythm: Normal rate and regular rhythm.  Pulmonary:     Effort: No respiratory distress.     Breath sounds: Normal breath sounds. No wheezing.  Abdominal:     General: Bowel sounds are normal.     Palpations: Abdomen is soft.     Tenderness: There is no abdominal tenderness.  Genitourinary:    Comments: Normal external genitalia.   Vaginal vault without lesions.  Cervix identified.  Could not appreciate any adnexal masses or tenderness.  Minimal erythema - peri rectal/peri vaginal area.  No discharge.   Musculoskeletal:        General: No swelling or tenderness.     Cervical back: Neck supple. No tenderness.  Lymphadenopathy:     Cervical: No cervical adenopathy.  Skin:    Findings: No erythema or rash.  Neurological:     Mental Status: She is alert.  Psychiatric:        Mood and Affect: Mood normal.        Behavior: Behavior normal.     Outpatient Encounter Medications as of 09/01/2021  Medication Sig   Acetaminophen (TYLENOL PO) Take by mouth as needed.   amLODipine (NORVASC) 5 MG tablet Take 1 tablet (5 mg total) by mouth daily.   nystatin cream (MYCOSTATIN) Apply 1 application topically 2 (two) times daily.   albuterol (VENTOLIN HFA) 108 (90 Base) MCG/ACT inhaler Inhale 2 puffs into the lungs every 4 (four) hours as needed.    [DISCONTINUED] triamcinolone (NASACORT) 55 MCG/ACT AERO nasal inhaler Place 2 sprays into the nose daily. Do this in the evening (Patient not taking: Reported on 09/01/2021)   No facility-administered encounter medications on file as of 09/01/2021.     Lab Results   Component Value Date   WBC 7.0 02/08/2021   HGB 12.3 02/08/2021   HCT 36.8 02/08/2021   PLT 215 02/08/2021   GLUCOSE 90 02/08/2021   CHOL 197 02/08/2021   TRIG 94 02/08/2021   HDL 52 02/08/2021  LDLCALC 128 (H) 02/08/2021   ALT 8 02/08/2021   AST 18 02/08/2021   NA 142 02/08/2021   K 4.0 02/08/2021   CL 104 02/08/2021   CREATININE 0.86 02/08/2021   BUN 13 02/08/2021   CO2 20 02/08/2021   TSH 3.400 02/08/2021    MM 3D SCREEN BREAST BILATERAL  Result Date: 07/22/2021 CLINICAL DATA:  Screening. EXAM: DIGITAL SCREENING BILATERAL MAMMOGRAM WITH TOMOSYNTHESIS AND CAD TECHNIQUE: Bilateral screening digital craniocaudal and mediolateral oblique mammograms were obtained. Bilateral screening digital breast tomosynthesis was performed. The images were evaluated with computer-aided detection. COMPARISON:  Previous exam(s). ACR Breast Density Category b: There are scattered areas of fibroglandular density. FINDINGS: There are no findings suspicious for malignancy. IMPRESSION: No mammographic evidence of malignancy. A result letter of this screening mammogram will be mailed directly to the patient. RECOMMENDATION: Screening mammogram in one year. (Code:SM-B-01Y) BI-RADS CATEGORY  1: Negative. Electronically Signed   By: Meda Klinefelter M.D.   On: 07/22/2021 12:31      Assessment & Plan:   Problem List Items Addressed This Visit     Essential hypertension    Blood pressure elevated.  Treat vaginal issues as outlined.  Follow pressures.  Send in readings.        Relevant Medications   amLODipine (NORVASC) 5 MG tablet   Pulmonary nodules    Saw pulmonary 12/2019.  Had CT 12/2019 as outlined.  Recommended f/u chest CT 12/2020.  Discussed today.  She declines at this time.  Will notify me when agreeable.       Rash    Persistent intermittent outbreaks.  Refer to allergist for further evaluation.        Relevant Orders   Ambulatory referral to Allergy   Vaginal irritation    Nystatin  cream as directed.  Follow.  Call with update.        Other Visit Diagnoses     Need for immunization against influenza    -  Primary   Relevant Orders   Flu Vaccine QUAD 14mo+IM (Fluarix, Fluzone & Alfiuria Quad PF) (Completed)        Dale [place], MD

## 2021-09-02 ENCOUNTER — Telehealth: Payer: Self-pay

## 2021-09-02 NOTE — Telephone Encounter (Signed)
Labs ordered for [facility] (fasting labs). Orders signed and placed in box at [facility].

## 2021-09-03 NOTE — Telephone Encounter (Signed)
LM for patient letting her know that labs have been ordered.

## 2021-09-03 NOTE — Telephone Encounter (Signed)
Opened in error

## 2021-09-05 ENCOUNTER — Encounter: Payer: Self-pay | Admitting: Internal Medicine

## 2021-09-05 DIAGNOSIS — R21 Rash and other nonspecific skin eruption: Secondary | ICD-10-CM | POA: Insufficient documentation

## 2021-09-05 DIAGNOSIS — N898 Other specified noninflammatory disorders of vagina: Secondary | ICD-10-CM | POA: Insufficient documentation

## 2021-09-05 NOTE — Assessment & Plan Note (Signed)
Blood pressure elevated.  Treat vaginal issues as outlined.  Follow pressures.  Send in readings.

## 2021-09-05 NOTE — Assessment & Plan Note (Signed)
Nystatin cream as directed.  Follow.  Call with update.

## 2021-09-05 NOTE — Assessment & Plan Note (Signed)
Persistent intermittent outbreaks.  Refer to allergist for further evaluation.

## 2021-09-05 NOTE — Assessment & Plan Note (Signed)
Saw pulmonary 12/2019.  Had CT 12/2019 as outlined.  Recommended f/u chest CT 12/2020.  Discussed today.  She declines at this time.  Will notify me when agreeable.

## 2021-09-16 ENCOUNTER — Encounter: Payer: Self-pay | Admitting: Internal Medicine

## 2021-09-16 ENCOUNTER — Telehealth: Payer: Self-pay

## 2021-09-16 DIAGNOSIS — N898 Other specified noninflammatory disorders of vagina: Secondary | ICD-10-CM

## 2021-09-16 NOTE — Telephone Encounter (Signed)
If persistent irritation, not responding the cream, then I recommend gyn evaluation.  (Needs referral to gyn - need to clarify if has specific gyn she prefers to see)

## 2021-09-16 NOTE — Telephone Encounter (Signed)
Dr Lorin Picket, the cream that I'm using does not seem to be clearing up the itching completely still having some discomfort

## 2021-09-16 NOTE — Addendum Note (Signed)
Addended by: Charm Barges on: 09/16/2021 04:09 PM   Modules accepted: Orders

## 2021-09-16 NOTE — Addendum Note (Signed)
Addended by: Elise Benne T on: 09/16/2021 02:56 PM   Modules accepted: Orders

## 2021-09-16 NOTE — Telephone Encounter (Signed)
Spoken to patient referral has been pended for review.

## 2021-09-17 NOTE — Addendum Note (Signed)
Addended by: Charm Barges on: 09/17/2021 01:43 PM   Modules accepted: Orders

## 2021-09-17 NOTE — Telephone Encounter (Signed)
Order placed for gyn referral.  

## 2021-09-20 ENCOUNTER — Telehealth: Payer: Self-pay

## 2021-09-20 NOTE — Telephone Encounter (Signed)
[facility] referring for Vaginal Itching.  Persistent despite medication. Called and left voicemail for patient to call back to be scheduled.

## 2021-09-21 NOTE — Telephone Encounter (Signed)
Contacted patient via phone. Patient states she no longer needs referral and requests to cancel it at this time.

## 2021-10-12 ENCOUNTER — Other Ambulatory Visit: Payer: Self-pay | Admitting: Internal Medicine

## 2021-10-22 ENCOUNTER — Encounter: Payer: Self-pay | Admitting: Internal Medicine

## 2021-10-22 ENCOUNTER — Other Ambulatory Visit: Payer: Self-pay

## 2021-10-22 ENCOUNTER — Ambulatory Visit (INDEPENDENT_AMBULATORY_CARE_PROVIDER_SITE_OTHER): Payer: Managed Care, Other (non HMO) | Admitting: Internal Medicine

## 2021-10-22 DIAGNOSIS — F439 Reaction to severe stress, unspecified: Secondary | ICD-10-CM

## 2021-10-22 DIAGNOSIS — I10 Essential (primary) hypertension: Secondary | ICD-10-CM

## 2021-10-22 DIAGNOSIS — B182 Chronic viral hepatitis C: Secondary | ICD-10-CM

## 2021-10-22 DIAGNOSIS — R918 Other nonspecific abnormal finding of lung field: Secondary | ICD-10-CM

## 2021-10-22 DIAGNOSIS — H5789 Other specified disorders of eye and adnexa: Secondary | ICD-10-CM

## 2021-10-22 DIAGNOSIS — Z1211 Encounter for screening for malignant neoplasm of colon: Secondary | ICD-10-CM

## 2021-10-22 DIAGNOSIS — R0683 Snoring: Secondary | ICD-10-CM

## 2021-10-22 DIAGNOSIS — N898 Other specified noninflammatory disorders of vagina: Secondary | ICD-10-CM | POA: Diagnosis not present

## 2021-10-22 NOTE — Progress Notes (Signed)
Patient ID: Kimberly Madden, female   DOB: Sep 25, 1961, 60 y.o.   MRN: [id number]   Subjective:    Patient ID: Kimberly Madden, female    DOB: 07/17/1961, 60 y.o.   MRN: [id number]  This visit occurred during the SARS-CoV-2 public health emergency.  Safety protocols were in place, including screening questions prior to the visit, additional usage of staff PPE, and extensive cleaning of exam room while observing appropriate contact time as indicated for disinfecting solutions.   Patient here for a scheduled follow up.  Marland Kitchen   HPI Here to follow up regarding her blood pressure.  Recently had problems with vaginal irritation.  This has resolved.  Not an issue for her now.  Tries to stay active.  No chest pain or sob reported.  No abdominal pain reported.  Some constipation.  Discussed fiber/miralax.  Not getting up and moving around as much.  Discussed diet and exercise.  Has noticed burning - eyes - vision.  Sees Dr Alvester Morin.  No vision loss.  Discussed artifical tears.  Discussed f/u with Dr Alvester Morin.  She does report increased snoring.  No significant fatigue.  Discussed the need for f/u chest CT.  She continues to decline.     Past Medical History:  Diagnosis Date   Hepatitis C    Past Surgical History:  Procedure Laterality Date   BREAST BIOPSY Left 2006   neg   BREAST SURGERY Left    benign biopsy   Family History  Problem Relation Age of Onset   Cancer Paternal Aunt 31       breast cancer - died   Cancer Paternal Aunt 19       lung cancer - smoker - died   Diabetes Paternal Aunt    Colon cancer Neg Hx    Stomach cancer Neg Hx    Breast cancer Neg Hx    Social History   Socioeconomic History   Marital status: Married    Spouse name: Bruce   Number of children: 1   Years of education: 12   Highest education level: Not on file  Occupational History   Occupation: Nutritional therapist: [employer]  Tobacco Use   Smoking status: Former    Packs/day: 0.25    Years: 3.00    Pack  years: 0.75    Types: Cigarettes    Quit date: 08/01/1983    Years since quitting: 38.2   Smokeless tobacco: Never  Substance and Sexual Activity   Alcohol use: Yes   Drug use: No   Sexual activity: Not on file  Other Topics Concern   Not on file  Social History Narrative   Abril grew up in [place], [place]. She lives at home with her husband, Smitty Cords, of 20 years. Avila has a daughter, Drema Pry, who lives in [place]. [place] and her husband have a dog named Will. She enjoys traveling and gardening. She has a vegetable garden that she tends to. She also enjoys spending time with friends and family. 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   Social Determinants of Health   Financial Resource Strain: Not on file  Food Insecurity: Not on file  Transportation Needs: Not on file  Physical Activity: Not on file  Stress: Not on file  Social Connections: Not on file     Review of Systems  Constitutional:  Negative for appetite change and unexpected weight change.  HENT:  Negative for congestion and sinus pressure.   Respiratory:  Negative for cough, chest tightness and shortness of breath.   Cardiovascular:  Negative for chest pain, palpitations and leg swelling.  Gastrointestinal:  Positive for constipation. Negative for abdominal pain, nausea and vomiting.  Genitourinary:  Negative for difficulty urinating and dysuria.  Musculoskeletal:  Negative for joint swelling and myalgias.  Skin:  Negative for rash.  Neurological:  Negative for dizziness, light-headedness and headaches.  Psychiatric/Behavioral:  Negative for agitation and dysphoric mood.       Objective:     BP 128/68    Pulse 81    Temp 98.3 F (36.8 C)    Ht 5' 4.02" (1.626 m)    Wt 171 lb 12.8 oz (77.9 kg)    LMP 11/06/2012    SpO2 98%    BMI 29.47 kg/m  Wt Readings from Last 3 Encounters:  10/22/21 171 lb 12.8 oz (77.9 kg)  09/01/21 168 lb 3 oz (76.3 kg)  02/08/21 167 lb 6.4 oz (75.9 kg)     Physical Exam Vitals reviewed.  Constitutional:      General: She is not in acute distress.    Appearance: Normal appearance.  HENT:     Head: Normocephalic and atraumatic.     Right Ear: External ear normal.     Left Ear: External ear normal.  Eyes:     General: No scleral icterus.       Right eye: No discharge.        Left eye: No discharge.     Conjunctiva/sclera: Conjunctivae normal.  Neck:     Thyroid: No thyromegaly.  Cardiovascular:     Rate and Rhythm: Normal rate and regular rhythm.  Pulmonary:     Effort: No respiratory distress.     Breath sounds: Normal breath sounds. No wheezing.  Abdominal:     General: Bowel sounds are normal.     Palpations: Abdomen is soft.     Tenderness: There is no abdominal tenderness.  Musculoskeletal:        General: No swelling or tenderness.     Cervical back: Neck supple. No tenderness.  Lymphadenopathy:     Cervical: No cervical adenopathy.  Skin:    Findings: No erythema or rash.  Neurological:     Mental Status: She is alert.  Psychiatric:        Mood and Affect: Mood normal.        Behavior: Behavior normal.     Outpatient Encounter Medications as of 10/22/2021  Medication Sig   Acetaminophen (TYLENOL PO) Take by mouth as needed.   albuterol (VENTOLIN HFA) 108 (90 Base) MCG/ACT inhaler Inhale 2 puffs into the lungs every 4 (four) hours as needed.    amLODipine (NORVASC) 5 MG tablet TAKE 1 TABLET(5 MG) BY MOUTH DAILY   Cholecalciferol (VITAMIN D3) 50 MCG (2000 UT) CAPS Take by mouth.   nystatin cream (MYCOSTATIN) Apply 1 application topically 2 (two) times daily.   No facility-administered encounter medications on file as of 10/22/2021.     Lab Results  Component Value Date   WBC 7.0 02/08/2021   HGB 12.3 02/08/2021   HCT 36.8 02/08/2021   PLT 215 02/08/2021   GLUCOSE 90 02/08/2021   CHOL 197 02/08/2021   TRIG 94 02/08/2021   HDL 52 02/08/2021   LDLCALC 128 (H) 02/08/2021   ALT 8 02/08/2021   AST 18  02/08/2021   NA 142 02/08/2021   K 4.0 02/08/2021   CL 104 02/08/2021   CREATININE 0.86 02/08/2021   BUN  13 02/08/2021   CO2 20 02/08/2021   TSH 3.400 02/08/2021    MM 3D SCREEN BREAST BILATERAL  Result Date: 07/22/2021 CLINICAL DATA:  Screening. EXAM: DIGITAL SCREENING BILATERAL MAMMOGRAM WITH TOMOSYNTHESIS AND CAD TECHNIQUE: Bilateral screening digital craniocaudal and mediolateral oblique mammograms were obtained. Bilateral screening digital breast tomosynthesis was performed. The images were evaluated with computer-aided detection. COMPARISON:  Previous exam(s). ACR Breast Density Category b: There are scattered areas of fibroglandular density. FINDINGS: There are no findings suspicious for malignancy. IMPRESSION: No mammographic evidence of malignancy. A result letter of this screening mammogram will be mailed directly to the patient. RECOMMENDATION: Screening mammogram in one year. (Code:SM-B-01Y) BI-RADS CATEGORY  1: Negative. Electronically Signed   By: Meda Klinefelter M.D.   On: 07/22/2021 12:31      Assessment & Plan:   Problem List Items Addressed This Visit     Burning sensation of eye    No redness.  No vision loss.  Plans to f/u with her optometrist (Dr Alvester Morin).        Colon cancer screening    Colonoscopy 2015.  IFOB 02/2021 - negative.      Essential hypertension    She is on amlodipine.  Blood pressure elevated on my check.  Have her spot check her pressures.  Send in readings.  Get her back in soon to reassess.  Follow pressures.  Follow metabolic panel.        Hepatitis C without hepatic coma    S/p treatment with Harvoni.  Previously followed by GI.  Follow liver function tests.        Pulmonary nodules    Saw pulmonary 12/2019.  Had CT 12/2019 as outlined.  Recommended f/u chest CT 12/2020.  Has declined previously.  Discussed today regarding need for f/u chest CT.  She continues to decline.        Snoring    Increased snoring.  Discussed.  No increased  fatigue.  Elevated blood pressure.  Discussed further w/up for sleep apnea.  Agreeable.        Relevant Orders   Ambulatory referral to Pulmonology   Stress    Overall appears to be doing relatively well.  Follow.       Vaginal irritation    Resolved.          Dale [place], MD

## 2021-10-25 ENCOUNTER — Encounter: Payer: Self-pay | Admitting: Internal Medicine

## 2021-10-25 DIAGNOSIS — H5789 Other specified disorders of eye and adnexa: Secondary | ICD-10-CM | POA: Insufficient documentation

## 2021-10-25 DIAGNOSIS — R0683 Snoring: Secondary | ICD-10-CM | POA: Insufficient documentation

## 2021-10-25 NOTE — Assessment & Plan Note (Signed)
Saw pulmonary 12/2019.  Had CT 12/2019 as outlined.  Recommended f/u chest CT 12/2020.  Has declined previously.  Discussed today regarding need for f/u chest CT.  She continues to decline.

## 2021-10-25 NOTE — Assessment & Plan Note (Signed)
Overall appears to be doing relatively well.  Follow.   

## 2021-10-25 NOTE — Assessment & Plan Note (Signed)
Resolved

## 2021-10-25 NOTE — Assessment & Plan Note (Signed)
No redness.  No vision loss.  Plans to f/u with her optometrist (Dr Alvester Morin).

## 2021-10-25 NOTE — Assessment & Plan Note (Signed)
Colonoscopy 2015.  IFOB 02/2021 - negative.

## 2021-10-25 NOTE — Assessment & Plan Note (Signed)
She is on amlodipine.  Blood pressure elevated on my check.  Have her spot check her pressures.  Send in readings.  Get her back in soon to reassess.  Follow pressures.  Follow metabolic panel.

## 2021-10-25 NOTE — Assessment & Plan Note (Signed)
S/p treatment with Harvoni.  Previously followed by GI.  Follow liver function tests.

## 2021-10-25 NOTE — Assessment & Plan Note (Signed)
Increased snoring.  Discussed.  No increased fatigue.  Elevated blood pressure.  Discussed further w/up for sleep apnea.  Agreeable.

## 2021-12-28 LAB — BASIC METABOLIC PANEL
BUN/Creatinine Ratio: 14 (ref 12–28)
BUN: 12 mg/dL (ref 8–27)
CO2: 23 mmol/L (ref 20–29)
Calcium: 10.1 mg/dL (ref 8.7–10.3)
Chloride: 107 mmol/L — ABNORMAL HIGH (ref 96–106)
Creatinine, Ser: 0.86 mg/dL (ref 0.57–1.00)
Glucose: 120 mg/dL — ABNORMAL HIGH (ref 70–99)
Potassium: 4.7 mmol/L (ref 3.5–5.2)
Sodium: 143 mmol/L (ref 134–144)
eGFR: 77 mL/min/{1.73_m2} (ref >59)

## 2021-12-28 LAB — HEPATIC FUNCTION PANEL
ALT: 8 IU/L (ref 0–32)
AST: 14 IU/L (ref 0–40)
Albumin: 4.5 g/dL (ref 3.8–4.8)
Alkaline Phosphatase: 62 IU/L (ref 44–121)
Bilirubin Total: 0.3 mg/dL (ref 0.0–1.2)
Bilirubin, Direct: <0.1 mg/dL (ref 0.00–0.40)
Total Protein: 7.6 g/dL (ref 6.0–8.5)

## 2021-12-28 LAB — CBC WITH DIFFERENTIAL/PLATELET
Basophils Absolute: 0.1 10*3/uL (ref 0.0–0.2)
Basos: 1 %
EOS (ABSOLUTE): 0.2 10*3/uL (ref 0.0–0.4)
Eos: 2 %
Hematocrit: 36.6 % (ref 34.0–46.6)
Hemoglobin: 12.3 g/dL (ref 11.1–15.9)
Immature Grans (Abs): 0 10*3/uL (ref 0.0–0.1)
Immature Granulocytes: 0 %
Lymphocytes Absolute: 4.2 10*3/uL — ABNORMAL HIGH (ref 0.7–3.1)
Lymphs: 49 %
MCH: 30.1 pg (ref 26.6–33.0)
MCHC: 33.6 g/dL (ref 31.5–35.7)
MCV: 90 fL (ref 79–97)
Monocytes Absolute: 0.9 10*3/uL (ref 0.1–0.9)
Monocytes: 11 %
Neutrophils Absolute: 3.1 10*3/uL (ref 1.4–7.0)
Neutrophils: 37 %
Platelets: 236 10*3/uL (ref 150–450)
RBC: 4.09 x10E6/uL (ref 3.77–5.28)
RDW: 12.1 % (ref 11.7–15.4)
WBC: 8.4 10*3/uL (ref 3.4–10.8)

## 2021-12-28 LAB — LIPID PANEL
Chol/HDL Ratio: 3.7 ratio (ref 0.0–4.4)
Cholesterol, Total: 203 mg/dL — ABNORMAL HIGH (ref 100–199)
HDL: 55 mg/dL (ref >39)
LDL Chol Calc (NIH): 129 mg/dL — ABNORMAL HIGH (ref 0–99)
Triglycerides: 105 mg/dL (ref 0–149)
VLDL Cholesterol Cal: 19 mg/dL (ref 5–40)

## 2021-12-28 LAB — VITAMIN D 25 HYDROXY (VIT D DEFICIENCY, FRACTURES): Vit D, 25-Hydroxy: 30.6 ng/mL (ref 30.0–100.0)

## 2021-12-28 LAB — HEMOGLOBIN A1C
Est. average glucose Bld gHb Est-mCnc: 117 mg/dL
Hgb A1c MFr Bld: 5.7 % — ABNORMAL HIGH (ref 4.8–5.6)

## 2021-12-30 ENCOUNTER — Other Ambulatory Visit: Payer: Self-pay

## 2021-12-30 ENCOUNTER — Encounter: Payer: Self-pay | Admitting: Internal Medicine

## 2021-12-30 ENCOUNTER — Ambulatory Visit: Payer: Managed Care, Other (non HMO) | Admitting: Internal Medicine

## 2021-12-30 VITALS — BP 136/70 | HR 75 | Temp 97.9°F | Resp 17 | Ht 64.0 in | Wt 168.4 lb

## 2021-12-30 DIAGNOSIS — R9389 Abnormal findings on diagnostic imaging of other specified body structures: Secondary | ICD-10-CM

## 2021-12-30 DIAGNOSIS — Z13220 Encounter for screening for lipoid disorders: Secondary | ICD-10-CM | POA: Diagnosis not present

## 2021-12-30 DIAGNOSIS — R0602 Shortness of breath: Secondary | ICD-10-CM

## 2021-12-30 DIAGNOSIS — I10 Essential (primary) hypertension: Secondary | ICD-10-CM | POA: Diagnosis not present

## 2021-12-30 DIAGNOSIS — B182 Chronic viral hepatitis C: Secondary | ICD-10-CM

## 2021-12-30 DIAGNOSIS — R739 Hyperglycemia, unspecified: Secondary | ICD-10-CM

## 2021-12-30 DIAGNOSIS — Z8639 Personal history of other endocrine, nutritional and metabolic disease: Secondary | ICD-10-CM | POA: Diagnosis not present

## 2021-12-30 DIAGNOSIS — R918 Other nonspecific abnormal finding of lung field: Secondary | ICD-10-CM

## 2021-12-30 DIAGNOSIS — E7800 Pure hypercholesterolemia, unspecified: Secondary | ICD-10-CM

## 2021-12-30 DIAGNOSIS — F439 Reaction to severe stress, unspecified: Secondary | ICD-10-CM

## 2021-12-30 MED ORDER — AMLODIPINE BESYLATE 10 MG PO TABS: 10.0000 mg | ORAL_TABLET | Freq: Every day | ORAL | 1 refills | Status: DC

## 2021-12-30 NOTE — Assessment & Plan Note (Addendum)
The 10-year ASCVD risk score (Arnett DK, et al., 2019) is: 8.7% ?  Values used to calculate the score: ?    Age: 61 years ?    Sex: Female ?    Is Non-Hispanic African American: Yes ?    Diabetic: No ?    Tobacco smoker: No ?    Systolic Blood Pressure: 136 mmHg ?    Is BP treated: Yes ?    HDL Cholesterol: 55 mg/dL ?    Total Cholesterol: 203 mg/dL  ?Have discussed calculated cholesterol risk and cholesterol medication.  Elects diet and exercise.  Follow lipid panel.  ?

## 2021-12-30 NOTE — Progress Notes (Signed)
Patient ID: Kimberly Madden, female   DOB: Sep 07, 1961, 61 y.o.   MRN: [id number]   Subjective:    Patient ID: Kimberly Madden, female    DOB: 1961-01-09, 61 y.o.   MRN: [id number]  This visit occurred during the SARS-CoV-2 public health emergency.  Safety protocols were in place, including screening questions prior to the visit, additional usage of staff PPE, and extensive cleaning of exam room while observing appropriate contact time as indicated for disinfecting solutions.   Patient here for a scheduled follow up.   Chief Complaint  Patient presents with   Follow-up    F/u - Hypertension check. Pt c/o continued SOB since COVID. Also expiriencing brain fogs. Has family HX of dementia ( mom ). Concerned.    Marland Kitchen   HPI Reports that since covid, she has noticed more sob.  States gets out of breath when talking.  Some sob with exertion.  No chest pain.  She is eating.  No nausea or vomiting.  No abdominal pain.  Miralax - helps bowels.  She is also concerned regarding brain fog.  Notices with her work - looking at [employer].  Also notices will misplace items.  Mother has dementia.  Some increased stress related.  (Sister lung cancer).     Past Medical History:  Diagnosis Date   Hepatitis C    Past Surgical History:  Procedure Laterality Date   BREAST BIOPSY Left 2006   neg   BREAST SURGERY Left    benign biopsy   Family History  Problem Relation Age of Onset   Cancer Paternal Aunt 45       breast cancer - died   Cancer Paternal Aunt 49       lung cancer - smoker - died   Diabetes Paternal Aunt    Colon cancer Neg Hx    Stomach cancer Neg Hx    Breast cancer Neg Hx    Social History   Socioeconomic History   Marital status: Married    Spouse name: Bruce   Number of children: 1   Years of education: 12   Highest education level: Not on file  Occupational History   Occupation: Research scientist (physical sciences): [employer]  Tobacco Use   Smoking status: Former    Packs/day: 0.25     Years: 3.00    Pack years: 0.75    Types: Cigarettes    Quit date: 08/01/1983    Years since quitting: 38.4   Smokeless tobacco: Never  Substance and Sexual Activity   Alcohol use: Yes   Drug use: No   Sexual activity: Not on file  Other Topics Concern   Not on file  Social History Narrative   Marlisa grew up in [place], [place]. She lives at home with her husband, Darnell Level, of 20 years. Honore has a daughter, Myrlene Broker, who lives in [place]. Marriana and her husband have a dog named Will. She enjoys traveling and gardening. She has a vegetable garden that she tends to. She also enjoys spending time with friends and family. 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   Social Determinants of Health   Financial Resource Strain: Not on file  Food Insecurity: Not on file  Transportation Needs: Not on file  Physical Activity: Not on file  Stress: Not on file  Social Connections: Not on file     Review of Systems  Constitutional:  Negative for appetite change and unexpected weight change.  HENT:  Negative for  congestion and sinus pressure.   Respiratory:  Positive for shortness of breath. Negative for cough and chest tightness.   Cardiovascular:  Negative for chest pain, palpitations and leg swelling.  Gastrointestinal:  Negative for abdominal pain, diarrhea, nausea and vomiting.  Genitourinary:  Negative for difficulty urinating and dysuria.  Musculoskeletal:  Negative for joint swelling and myalgias.  Skin:  Negative for color change and rash.  Neurological:  Negative for dizziness, light-headedness and headaches.  Psychiatric/Behavioral:  Negative for agitation and dysphoric mood.       Objective:     BP 136/70 (BP Location: Right Arm, Patient Position: Sitting, Cuff Size: Small)    Pulse 75    Temp 97.9 F (36.6 C) (Oral)    Resp 17    Ht 5' 4"  (1.626 m)    Wt 168 lb 6.4 oz (76.4 kg)    LMP 11/06/2012    SpO2 99%    BMI 28.91 kg/m  Wt Readings from Last 3  Encounters:  12/30/21 168 lb 6.4 oz (76.4 kg)  10/22/21 171 lb 12.8 oz (77.9 kg)  09/01/21 168 lb 3 oz (76.3 kg)    Physical Exam Vitals reviewed.  Constitutional:      General: She is not in acute distress.    Appearance: Normal appearance.  HENT:     Head: Normocephalic and atraumatic.     Right Ear: External ear normal.     Left Ear: External ear normal.  Eyes:     General: No scleral icterus.       Right eye: No discharge.        Left eye: No discharge.     Conjunctiva/sclera: Conjunctivae normal.  Neck:     Thyroid: No thyromegaly.  Cardiovascular:     Rate and Rhythm: Normal rate and regular rhythm.  Pulmonary:     Effort: No respiratory distress.     Breath sounds: Normal breath sounds. No wheezing.  Abdominal:     General: Bowel sounds are normal.     Palpations: Abdomen is soft.     Tenderness: There is no abdominal tenderness.  Musculoskeletal:        General: No swelling or tenderness.     Cervical back: Neck supple. No tenderness.  Lymphadenopathy:     Cervical: No cervical adenopathy.  Skin:    Findings: No erythema or rash.  Neurological:     Mental Status: She is alert.  Psychiatric:        Mood and Affect: Mood normal.        Behavior: Behavior normal.     Outpatient Encounter Medications as of 12/30/2021  Medication Sig   Acetaminophen (TYLENOL PO) Take by mouth as needed.   albuterol (VENTOLIN HFA) 108 (90 Base) MCG/ACT inhaler Inhale 2 puffs into the lungs every 4 (four) hours as needed.    amLODipine (NORVASC) 10 MG tablet Take 1 tablet (10 mg total) by mouth daily.   Cholecalciferol (VITAMIN D3) 50 MCG (2000 UT) CAPS Take by mouth.   [DISCONTINUED] amLODipine (NORVASC) 5 MG tablet TAKE 1 TABLET(5 MG) BY MOUTH DAILY   [DISCONTINUED] nystatin cream (MYCOSTATIN) Apply 1 application topically 2 (two) times daily. (Patient not taking: Reported on 12/30/2021)   No facility-administered encounter medications on file as of 12/30/2021.     Lab Results   Component Value Date   WBC 8.4 12/27/2021   HGB 12.3 12/27/2021   HCT 36.6 12/27/2021   PLT 236 12/27/2021   GLUCOSE 120 (H) 12/27/2021  CHOL 203 (H) 12/27/2021   TRIG 105 12/27/2021   HDL 55 12/27/2021   LDLCALC 129 (H) 12/27/2021   ALT 8 12/27/2021   AST 14 12/27/2021   NA 143 12/27/2021   K 4.7 12/27/2021   CL 107 (H) 12/27/2021   CREATININE 0.86 12/27/2021   BUN 12 12/27/2021   CO2 23 12/27/2021   TSH 3.400 02/08/2021   HGBA1C 5.7 (H) 12/27/2021    MM 3D SCREEN BREAST BILATERAL  Result Date: 07/22/2021 CLINICAL DATA:  Screening. EXAM: DIGITAL SCREENING BILATERAL MAMMOGRAM WITH TOMOSYNTHESIS AND CAD TECHNIQUE: Bilateral screening digital craniocaudal and mediolateral oblique mammograms were obtained. Bilateral screening digital breast tomosynthesis was performed. The images were evaluated with computer-aided detection. COMPARISON:  Previous exam(s). ACR Breast Density Category b: There are scattered areas of fibroglandular density. FINDINGS: There are no findings suspicious for malignancy. IMPRESSION: No mammographic evidence of malignancy. A result letter of this screening mammogram will be mailed directly to the patient. RECOMMENDATION: Screening mammogram in one year. (Code:SM-B-01Y) BI-RADS CATEGORY  1: Negative. Electronically Signed   By: Valentino Saxon M.D.   On: 07/22/2021 12:31      Assessment & Plan:   Problem List Items Addressed This Visit     Abnormal chest CT    Previous CT chest - no evidence of PE.  Small scattered nodules measuring less then 65m.  Saw pulmonary.  Recommended f/u CT 2022 as outlined.  Continues to decline.         Essential hypertension - Primary    Blood pressure elevated.  Discussed need to adjust medication.  Discussed adding low dose ARB vs increasing amlodipine.  Desires to increase amlodipine to 125mq day.  Follow pressures.  Get her back in soon to reassess.       Relevant Medications   amLODipine (NORVASC) 10 MG tablet    Other Relevant Orders   Comp Met (CMET)   Hepatitis C without hepatic coma    S/p treatment with Harvoni.  Previously followed by GI.  Follow liver function tests.        Relevant Orders   Hepatic function panel   History of vitamin D deficiency   Hypercholesteremia    The 10-year ASCVD risk score (Arnett DK, et al., 2019) is: 8.7%   Values used to calculate the score:     Age: [age]     Sex: Female     Is Non-Hispanic African American: Yes     Diabetic: No     Tobacco smoker: No     Systolic Blood Pressure: 13741mHg     Is BP treated: Yes     HDL Cholesterol: 55 mg/dL     Total Cholesterol: 203 mg/dL  Have discussed calculated cholesterol risk and cholesterol medication.  Elects diet and exercise.  Follow lipid panel.       Relevant Medications   amLODipine (NORVASC) 10 MG tablet   Other Relevant Orders   TSH   Pulmonary nodules    Saw pulmonary 12/2019.  Had CT 12/2019 as outlined.  Recommended f/u chest CT 12/2020.  Has declined previously.  Discussed today regarding need for f/u chest CT.  She continues to decline.  Discussed at least doing f/u cxr.  She initially agreed and prior to leaving - declined. Will notify me if changes her mind.       Shortness of breath    Reports noticing sob since covid.  Discussed possible etiologies.  Agreed to EKG.  EKG - SR  with no acute ischemic changes.  Discussed further cardiac w/up.  Discussed cxr. She declines.  Wants to monitor. Will notify me if she changes her mind or if symptoms change.  Get her back in soon to reassess.       Relevant Orders   EKG 12-Lead (Completed)   DG Chest 2 View   Stress    Increased stress.  Discussed.  Does not feel needs any further intervention.  Follow.       Other Visit Diagnoses     Hyperglycemia       Relevant Orders   HgB A1c   Screening cholesterol level       Relevant Orders   Lipid Profile        Einar Pheasant, MD

## 2022-01-02 ENCOUNTER — Encounter: Payer: Self-pay | Admitting: Internal Medicine

## 2022-01-02 NOTE — Assessment & Plan Note (Signed)
Reports noticing sob since covid.  Discussed possible etiologies.  Agreed to EKG.  EKG - SR with no acute ischemic changes.  Discussed further cardiac w/up.  Discussed cxr. She declines.  Wants to monitor. Will notify me if she changes her mind or if symptoms change.  Get her back in soon to reassess.  ?

## 2022-01-02 NOTE — Assessment & Plan Note (Signed)
Blood pressure elevated.  Discussed need to adjust medication.  Discussed adding low dose ARB vs increasing amlodipine.  Desires to increase amlodipine to 10mg  q day.  Follow pressures.  Get her back in soon to reassess.  ?

## 2022-01-02 NOTE — Assessment & Plan Note (Signed)
S/p treatment with Harvoni.  Previously followed by GI.  Follow liver function tests.   °

## 2022-01-02 NOTE — Assessment & Plan Note (Signed)
Previous CT chest - no evidence of PE.  Small scattered nodules measuring less then 27mm.  Saw pulmonary.  Recommended f/u CT 2022 as outlined.  Continues to decline.    ?

## 2022-01-02 NOTE — Assessment & Plan Note (Signed)
Increased stress.  Discussed.  Does not feel needs any further intervention.  Follow.   

## 2022-01-02 NOTE — Assessment & Plan Note (Signed)
Saw pulmonary 12/2019.  Had CT 12/2019 as outlined.  Recommended f/u chest CT 12/2020.  Has declined previously.  Discussed today regarding need for f/u chest CT.  She continues to decline.  Discussed at least doing f/u cxr.  She initially agreed and prior to leaving - declined. Will notify me if changes her mind.  ?

## 2022-02-03 ENCOUNTER — Encounter: Payer: Self-pay | Admitting: Internal Medicine

## 2022-02-03 NOTE — Telephone Encounter (Signed)
Called patient. Mail box full.

## 2022-02-07 ENCOUNTER — Ambulatory Visit: Payer: Managed Care, Other (non HMO) | Admitting: Internal Medicine

## 2022-02-07 NOTE — Telephone Encounter (Signed)
Attempted to call pt , no ans mailbox full ?Mychart msg sent ?

## 2022-04-21 ENCOUNTER — Telehealth: Payer: Self-pay | Admitting: Internal Medicine

## 2022-04-21 NOTE — Telephone Encounter (Signed)
Patient called and is asking Dr Lorin Picket not to prescribe amLODipine (NORVASC) 10 MG tablet, she has stopped taking it since it makes her legs swell.

## 2022-07-18 ENCOUNTER — Other Ambulatory Visit: Payer: Self-pay | Admitting: Internal Medicine

## 2022-07-18 DIAGNOSIS — Z1231 Encounter for screening mammogram for malignant neoplasm of breast: Secondary | ICD-10-CM

## 2022-08-09 ENCOUNTER — Ambulatory Visit
Admission: RE | Admit: 2022-08-09 | Discharge: 2022-08-09 | Disposition: A | Discharge status: Home / Self Care | Payer: Managed Care, Other (non HMO) | Source: Ambulatory Visit | Attending: Internal Medicine | Admitting: Internal Medicine

## 2022-08-09 DIAGNOSIS — Z1231 Encounter for screening mammogram for malignant neoplasm of breast: Secondary | ICD-10-CM | POA: Insufficient documentation

## 2023-02-10 ENCOUNTER — Encounter: Payer: Self-pay | Admitting: Internal Medicine

## 2023-02-10 ENCOUNTER — Ambulatory Visit (INDEPENDENT_AMBULATORY_CARE_PROVIDER_SITE_OTHER): Payer: Managed Care, Other (non HMO) | Admitting: Internal Medicine

## 2023-02-10 VITALS — BP 128/70 | HR 72 | Temp 98.2°F | Resp 16 | Ht 64.0 in | Wt 161.0 lb

## 2023-02-10 DIAGNOSIS — Z0000 Encounter for general adult medical examination without abnormal findings: Secondary | ICD-10-CM | POA: Diagnosis not present

## 2023-02-10 DIAGNOSIS — F439 Reaction to severe stress, unspecified: Secondary | ICD-10-CM

## 2023-02-10 DIAGNOSIS — R0602 Shortness of breath: Secondary | ICD-10-CM

## 2023-02-10 DIAGNOSIS — I10 Essential (primary) hypertension: Secondary | ICD-10-CM

## 2023-02-10 DIAGNOSIS — B182 Chronic viral hepatitis C: Secondary | ICD-10-CM

## 2023-02-10 DIAGNOSIS — R739 Hyperglycemia, unspecified: Secondary | ICD-10-CM | POA: Insufficient documentation

## 2023-02-10 DIAGNOSIS — Z1211 Encounter for screening for malignant neoplasm of colon: Secondary | ICD-10-CM

## 2023-02-10 DIAGNOSIS — Z124 Encounter for screening for malignant neoplasm of cervix: Secondary | ICD-10-CM | POA: Diagnosis not present

## 2023-02-10 DIAGNOSIS — R9389 Abnormal findings on diagnostic imaging of other specified body structures: Secondary | ICD-10-CM

## 2023-02-10 DIAGNOSIS — R918 Other nonspecific abnormal finding of lung field: Secondary | ICD-10-CM

## 2023-02-10 DIAGNOSIS — E7800 Pure hypercholesterolemia, unspecified: Secondary | ICD-10-CM

## 2023-02-10 NOTE — Progress Notes (Unsigned)
Subjective:    Patient ID: Kimberly Madden, female    DOB: [birth year], 62 y.o.   MRN: [id number]  Patient here for  Chief Complaint  Patient presents with   Annual Exam    HPI Previously had covid - last year.  Since covid - has noticed some sob with talking - job.  Overall stable, but can tell a change.  Had discussed pulmonary evaluation - discussed further testing, spirometry, etc. Had CT chest 12/2019 - scattered small nodules measuring less than 5 mm. Have discussed recommendation for f/u CT.  Has elected to hold on further scanning.  After discussion today, agreed to referral to pulmonary for f/u of above.  Still wants to hold on me ordering f/u CT.  No chest pain.  Tries to stay active.  Increased stress.  Discussed. Does not feel needs any further intervention.  No abdominal pain or bowel change reported.     Past Medical History:  Diagnosis Date   Hepatitis C    Past Surgical History:  Procedure Laterality Date   BREAST BIOPSY Left 2006   neg stereo   Family History  Problem Relation Age of Onset   Breast cancer Paternal Aunt 7   Cancer Paternal Aunt 28       lung cancer - smoker - died   Diabetes Paternal Aunt    Colon cancer Neg Hx    Stomach cancer Neg Hx    Social History   Socioeconomic History   Marital status: Married    Spouse name: Kimberly Madden   Number of children: 1   Years of education: 12   Highest education level: Not on file  Occupational History   Occupation: Nutritional therapist: [employer]  Tobacco Use   Smoking status: Former    Packs/day: 0.25    Years: 3.00    Additional pack years: 0.00    Total pack years: 0.75    Types: Cigarettes    Quit date: 08/01/1983    Years since quitting: 39.5   Smokeless tobacco: Never  Substance and Sexual Activity   Alcohol use: Yes   Drug use: No   Sexual activity: Not on file  Other Topics Concern   Not on file  Social History Narrative   Kimberly Madden grew up in [place], [place]. She lives at home with  her husband, Kimberly Madden, of 20 years. Kimberly Madden has a daughter, Kimberly Madden, who lives in [place]. Lashanti and her husband have a dog named Kimberly Madden. She enjoys traveling and gardening. She has a vegetable garden that she tends to. She also enjoys spending time with friends and family. 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   Social Determinants of Health   Financial Resource Strain: Not on file  Food Insecurity: Not on file  Transportation Needs: Not on file  Physical Activity: Not on file  Stress: Not on file  Social Connections: Not on file     Review of Systems  Constitutional:  Negative for appetite change and unexpected weight change.  HENT:  Negative for congestion, sinus pressure and sore throat.   Eyes:  Negative for pain and visual disturbance.  Respiratory:  Positive for shortness of breath. Negative for cough and chest tightness.   Cardiovascular:  Negative for chest pain and palpitations.  Gastrointestinal:  Negative for abdominal pain, diarrhea, nausea and vomiting.  Genitourinary:  Negative for difficulty urinating and dysuria.  Musculoskeletal:  Negative for joint swelling and myalgias.  Skin:  Negative for color change  and rash.  Neurological:  Negative for dizziness and headaches.  Hematological:  Negative for adenopathy. Does not bruise/bleed easily.  Psychiatric/Behavioral:  Negative for agitation and dysphoric mood.        Objective:     BP 128/70   Pulse 72   Temp 98.2 F (36.8 C)   Resp 16   Ht 5\' 4"  (1.626 m)   Wt 161 lb (73 kg)   LMP 11/06/2012   SpO2 99%   BMI 27.64 kg/m  Wt Readings from Last 3 Encounters:  02/10/23 161 lb (73 kg)  12/30/21 168 lb 6.4 oz (76.4 kg)  10/22/21 171 lb 12.8 oz (77.9 kg)    Physical Exam Vitals reviewed.  Constitutional:      General: She is not in acute distress.    Appearance: Normal appearance. She is well-developed.  HENT:     Head: Normocephalic and atraumatic.     Right Ear: External ear  normal.     Left Ear: External ear normal.  Eyes:     General: No scleral icterus.       Right eye: No discharge.        Left eye: No discharge.     Conjunctiva/sclera: Conjunctivae normal.  Neck:     Thyroid: No thyromegaly.  Cardiovascular:     Rate and Rhythm: Normal rate and regular rhythm.  Pulmonary:     Effort: No tachypnea, accessory muscle usage or respiratory distress.     Breath sounds: Normal breath sounds. No decreased breath sounds or wheezing.  Chest:  Breasts:    Right: No inverted nipple, mass, nipple discharge or tenderness (no axillary adenopathy).     Left: No inverted nipple, mass, nipple discharge or tenderness (no axilarry adenopathy).  Abdominal:     General: Bowel sounds are normal.     Palpations: Abdomen is soft.     Tenderness: There is no abdominal tenderness.  Genitourinary:    Comments: Normal external genitalia.  Vaginal vault without lesions.  Cervix identified.  Pap smear performed.  Could not appreciate any adnexal masses or tenderness.   Musculoskeletal:        General: No swelling or tenderness.     Cervical back: Neck supple.  Lymphadenopathy:     Cervical: No cervical adenopathy.  Skin:    Findings: No erythema or rash.  Neurological:     Mental Status: She is alert and oriented to person, place, and time.  Psychiatric:        Mood and Affect: Mood normal.        Behavior: Behavior normal.      Outpatient Encounter Medications as of 02/10/2023  Medication Sig   Acetaminophen (TYLENOL PO) Take by mouth as needed.   albuterol (VENTOLIN HFA) 108 (90 Base) MCG/ACT inhaler Inhale 2 puffs into the lungs every 4 (four) hours as needed.    amLODipine (NORVASC) 10 MG tablet Take 1 tablet (10 mg total) by mouth daily.   Cholecalciferol (VITAMIN D3) 50 MCG (2000 UT) CAPS Take by mouth.   No facility-administered encounter medications on file as of 02/10/2023.     Lab Results  Component Value Date   WBC 8.4 12/27/2021   HGB 12.3 12/27/2021    HCT 36.6 12/27/2021   PLT 236 12/27/2021   GLUCOSE 120 (H) 12/27/2021   CHOL 203 (H) 12/27/2021   TRIG 105 12/27/2021   HDL 55 12/27/2021   LDLCALC 129 (H) 12/27/2021   ALT 8 12/27/2021   AST 14 12/27/2021  NA 143 12/27/2021   K 4.7 12/27/2021   CL 107 (H) 12/27/2021   CREATININE 0.86 12/27/2021   BUN 12 12/27/2021   CO2 23 12/27/2021   TSH 3.400 02/08/2021   HGBA1C 5.7 (H) 12/27/2021    MM 3D SCREEN BREAST BILATERAL  Result Date: 08/10/2022 CLINICAL DATA:  Screening. EXAM: DIGITAL SCREENING BILATERAL MAMMOGRAM WITH TOMOSYNTHESIS AND CAD TECHNIQUE: Bilateral screening digital craniocaudal and mediolateral oblique mammograms were obtained. Bilateral screening digital breast tomosynthesis was performed. The images were evaluated with computer-aided detection. COMPARISON:  Previous exam(s). ACR Breast Density Category b: There are scattered areas of fibroglandular density. FINDINGS: There are no findings suspicious for malignancy. IMPRESSION: No mammographic evidence of malignancy. A result letter of this screening mammogram Kimberly Madden be mailed directly to the patient. RECOMMENDATION: Screening mammogram in one year. (Code:SM-B-01Y) BI-RADS CATEGORY  1: Negative. Electronically Signed   By: Sherian Rein M.D.   On: 08/10/2022 10:10       Assessment & Plan:  Routine general medical examination at a health care facility  Screening for cervical cancer -     IGP, Aptima HPV  Essential hypertension Assessment & Plan: Blood pressure better on amlodipine 10mg  q day.  Follow pressures.  Check metabolic panel. .   Orders: -     Basic metabolic panel -     Urinalysis, Routine w reflex microscopic  Hypercholesteremia Assessment & Plan: The 10-year ASCVD risk score (Arnett DK, et al., 2019) is: 7.8%   Values used to calculate the score:     Age: 69 years     Sex: Female     Is Non-Hispanic African American: Yes     Diabetic: No     Tobacco smoker: No     Systolic Blood Pressure: 128  mmHg     Is BP treated: Yes     HDL Cholesterol: 55 mg/dL     Total Cholesterol: 203 mg/dL  Have discussed calculated cholesterol risk and cholesterol medication.  Elects diet and exercise.  Follow lipid panel.   Orders: -     CBC with Differential/Platelet -     Lipid panel -     Hepatic function panel -     TSH  Hyperglycemia Assessment & Plan: Low carb diet and exercise.  Follow met b and A1c.   Orders: -     Hemoglobin A1c  Abnormal chest CT Assessment & Plan: Previous CT chest - no evidence of PE.  Small scattered nodules measuring less then 5mm.  Saw pulmonary.  Recommended f/u CT 2022 as outlined.  Continues to decline. She is agreeable for f/u with pulmonary.  Describes some persistent sob after covid as outlined.     Colon cancer screening Assessment & Plan: Colonoscopy 2015.  IFOB 02/2021 - negative.   Healthcare maintenance Assessment & Plan: Physical today 02/10/23  PAP today. Mammogram 08/09/22 - Birads I. Colonoscopy 2015.  IFOB negative last year.     Chronic hepatitis C without hepatic coma Assessment & Plan: S/p treatment with Harvoni.  Previously followed by GI.  Follow liver function tests.     Pulmonary nodules Assessment & Plan: Saw pulmonary 12/2019.  Had CT 12/2019 as outlined.  Recommended f/u chest CT 12/2020.  Has declined previously.  Discussed today regarding need for f/u chest CT.  She continues to decline CT, but did agree to f/u with pulmonary.  Noticed some sob after covid as outlined.  Refer back to pulmonary.   Orders: -     Ambulatory referral  to Pulmonology  Shortness of breath Assessment & Plan: Reports noticing sob since covid.  Stable from previous check.  Planning f/u with pulmonary as outlined.   Orders: -     Ambulatory referral to Pulmonology  Stress Assessment & Plan: Increased stress.  Discussed.  Does not feel needs any further intervention.  Follow.       Dale Kurten, MD

## 2023-02-11 ENCOUNTER — Encounter: Payer: Self-pay | Admitting: Internal Medicine

## 2023-02-11 NOTE — Assessment & Plan Note (Signed)
Low-carb diet and exercise.  Follow met b and A1c. 

## 2023-02-11 NOTE — Assessment & Plan Note (Signed)
Saw pulmonary 12/2019.  Had CT 12/2019 as outlined.  Recommended f/u chest CT 12/2020.  Has declined previously.  Discussed today regarding need for f/u chest CT.  She continues to decline CT, but did agree to f/u with pulmonary.  Noticed some sob after covid as outlined.  Refer back to pulmonary.

## 2023-02-11 NOTE — Assessment & Plan Note (Signed)
Previous CT chest - no evidence of PE.  Small scattered nodules measuring less then 7mm.  Saw pulmonary.  Recommended f/u CT 2022 as outlined.  Continues to decline. She is agreeable for f/u with pulmonary.  Describes some persistent sob after covid as outlined.

## 2023-02-11 NOTE — Assessment & Plan Note (Signed)
Blood pressure better on amlodipine 10mg  q day.  Follow pressures.  Check metabolic panel. Marland Kitchen

## 2023-02-11 NOTE — Assessment & Plan Note (Signed)
Reports noticing sob since covid.  Stable from previous check.  Planning f/u with pulmonary as outlined.

## 2023-02-11 NOTE — Assessment & Plan Note (Signed)
Physical today 02/10/23  PAP today. Mammogram 08/09/22 - Birads I. Colonoscopy 2015.  IFOB negative last year.

## 2023-02-11 NOTE — Assessment & Plan Note (Signed)
The 10-year ASCVD risk score (Arnett DK, et al., 2019) is: 7.8%   Values used to calculate the score:     Age: 62 years     Sex: Female     Is Non-Hispanic African American: Yes     Diabetic: No     Tobacco smoker: No     Systolic Blood Pressure: 128 mmHg     Is BP treated: Yes     HDL Cholesterol: 55 mg/dL     Total Cholesterol: 203 mg/dL  Have discussed calculated cholesterol risk and cholesterol medication.  Elects diet and exercise.  Follow lipid panel.

## 2023-02-11 NOTE — Assessment & Plan Note (Signed)
Colonoscopy 2015.  IFOB 02/2021 - negative. °

## 2023-02-11 NOTE — Assessment & Plan Note (Signed)
S/p treatment with Harvoni.  Previously followed by GI.  Follow liver function tests.   

## 2023-02-11 NOTE — Assessment & Plan Note (Signed)
Increased stress.  Discussed.  Does not feel needs any further intervention.  Follow.   

## 2023-02-14 ENCOUNTER — Encounter: Payer: Managed Care, Other (non HMO) | Admitting: Internal Medicine

## 2023-02-16 LAB — IGP, APTIMA HPV: HPV Aptima: NEGATIVE

## 2023-08-18 ENCOUNTER — Encounter: Payer: Self-pay | Admitting: Internal Medicine

## 2023-08-18 ENCOUNTER — Ambulatory Visit: Payer: Managed Care, Other (non HMO) | Admitting: Internal Medicine

## 2023-08-18 NOTE — Progress Notes (Signed)
Patient ID: Kimberly Madden, female   DOB: [birth year], 62 y.o.   MRN: [id number] Pt did not show for appt.

## 2023-12-22 ENCOUNTER — Other Ambulatory Visit: Payer: Self-pay | Admitting: Internal Medicine

## 2023-12-22 DIAGNOSIS — Z1231 Encounter for screening mammogram for malignant neoplasm of breast: Secondary | ICD-10-CM

## 2023-12-28 ENCOUNTER — Ambulatory Visit
Admission: RE | Admit: 2023-12-28 | Discharge: 2023-12-28 | Disposition: A | Discharge status: Home / Self Care | Payer: Managed Care, Other (non HMO) | Source: Ambulatory Visit | Attending: Internal Medicine | Admitting: Internal Medicine

## 2023-12-28 DIAGNOSIS — Z1231 Encounter for screening mammogram for malignant neoplasm of breast: Secondary | ICD-10-CM | POA: Insufficient documentation

## 2024-02-15 ENCOUNTER — Ambulatory Visit (INDEPENDENT_AMBULATORY_CARE_PROVIDER_SITE_OTHER): Payer: Managed Care, Other (non HMO) | Admitting: Internal Medicine

## 2024-02-15 ENCOUNTER — Encounter: Payer: Self-pay | Admitting: Internal Medicine

## 2024-02-15 VITALS — BP 142/86 | HR 75 | Resp 16 | Ht 64.0 in | Wt 160.8 lb

## 2024-02-15 DIAGNOSIS — E7800 Pure hypercholesterolemia, unspecified: Secondary | ICD-10-CM | POA: Diagnosis not present

## 2024-02-15 DIAGNOSIS — Z0000 Encounter for general adult medical examination without abnormal findings: Secondary | ICD-10-CM | POA: Diagnosis not present

## 2024-02-15 DIAGNOSIS — R739 Hyperglycemia, unspecified: Secondary | ICD-10-CM | POA: Diagnosis not present

## 2024-02-15 DIAGNOSIS — R9389 Abnormal findings on diagnostic imaging of other specified body structures: Secondary | ICD-10-CM

## 2024-02-15 DIAGNOSIS — I10 Essential (primary) hypertension: Secondary | ICD-10-CM

## 2024-02-15 DIAGNOSIS — Z1211 Encounter for screening for malignant neoplasm of colon: Secondary | ICD-10-CM | POA: Diagnosis not present

## 2024-02-15 DIAGNOSIS — F439 Reaction to severe stress, unspecified: Secondary | ICD-10-CM

## 2024-02-15 DIAGNOSIS — B182 Chronic viral hepatitis C: Secondary | ICD-10-CM

## 2024-02-15 MED ORDER — VALSARTAN 80 MG PO TABS: 80.0000 mg | ORAL_TABLET | Freq: Every day | ORAL | 1 refills | Status: DC

## 2024-02-15 NOTE — Assessment & Plan Note (Addendum)
 Physical today 02/15/24.  PAP 4/12/254 - negative with negative HPV. Mammogram 12/28/23 - Birads I. Colonoscopy 2015.  Due f/u colonoscopy. Order placed for referral to GI.

## 2024-02-15 NOTE — Progress Notes (Signed)
 Subjective:    Patient ID: Kimberly Madden, female    DOB: 1961/05/24, 63 y.o.   MRN: [id number]  Patient here for  Chief Complaint  Patient presents with   Annual Exam    HPI Here for a physical exam. Reports she is doing relatively well. Increased stress. Has good support. Does not feel needs any further intervention at this time. Had CT chest 12/2019 - scattered small nodules measuring less than 5 mm. Have discussed recommendation for f/u CT. Last visit, had agreed for referral to pulmonary. Referral placed. She reports she is doing well. No sob. No cough or congestion. Discussed f/u with pulmonary and/or follow up CT scan. She wants to hold at this time. No chest pain. No abdominal pain. No bowel change reported. Due colonoscopy. Discussed. Agreeable for referral.    Past Medical History:  Diagnosis Date   Hepatitis C    Past Surgical History:  Procedure Laterality Date   BREAST BIOPSY Left 2006   neg stereo   Family History  Problem Relation Age of Onset   Breast cancer Paternal Aunt 21   Cancer Paternal Aunt 84       lung cancer - smoker - died   Diabetes Paternal Aunt    Colon cancer Neg Hx    Stomach cancer Neg Hx    Social History   Socioeconomic History   Marital status: Married    Spouse name: Kimberly Madden   Number of children: 1   Years of education: 12   Highest education level: Not on file  Occupational History   Occupation: Nutritional therapist: [employer]  Tobacco Use   Smoking status: Former    Current packs/day: 0.00    Average packs/day: 0.3 packs/day for 3.0 years (0.8 ttl pk-yrs)    Types: Cigarettes    Start date: 07/31/1980    Quit date: 08/01/1983    Years since quitting: 40.5   Smokeless tobacco: Never  Substance and Sexual Activity   Alcohol use: Yes   Drug use: No   Sexual activity: Not on file  Other Topics Concern   Not on file  Social History Narrative   [place] She lives at home with her husband, Kimberly Madden, of  20 years. [place]onell has a daughter, Kimberly Madden, who lives in [place]. Kimberly Madden and her husband have a dog named Kimberly Madden. She enjoys traveling and gardening. She has a vegetable garden that she tends to. She also enjoys spending time with friends and family. 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   Social Drivers of Corporate investment banker Strain: Not on file  Food Insecurity: Not on file  Transportation Needs: Not on file  Physical Activity: Not on file  Stress: Not on file  Social Connections: Not on file     Review of Systems  Constitutional:  Negative for appetite change and unexpected weight change.  HENT:  Negative for congestion and sinus pressure.   Respiratory:  Negative for cough, chest tightness and shortness of breath.   Cardiovascular:  Negative for chest pain, palpitations and leg swelling.  Gastrointestinal:  Negative for abdominal pain, diarrhea, nausea and vomiting.  Genitourinary:  Negative for difficulty urinating and dysuria.  Musculoskeletal:  Negative for joint swelling and myalgias.  Skin:  Negative for color change and rash.  Neurological:  Negative for dizziness and headaches.  Psychiatric/Behavioral:  Negative for agitation and dysphoric mood.        Objective:  BP (!) 142/86   Pulse 75   Resp 16   Ht 5\' 4"  (1.626 m)   Wt 160 lb 12.8 oz (72.9 kg)   LMP 11/06/2012   SpO2 99%   BMI 27.60 kg/m  Wt Readings from Last 3 Encounters:  02/15/24 160 lb 12.8 oz (72.9 kg)  02/10/23 161 lb (73 kg)  12/30/21 168 lb 6.4 oz (76.4 kg)    Physical Exam Vitals reviewed.  Constitutional:      General: She is not in acute distress.    Appearance: Normal appearance. She is well-developed.  HENT:     Head: Normocephalic and atraumatic.     Right Ear: External ear normal.     Left Ear: External ear normal.     Mouth/Throat:     Pharynx: No oropharyngeal exudate or posterior oropharyngeal erythema.  Eyes:     General: No scleral  icterus.       Right eye: No discharge.        Left eye: No discharge.     Conjunctiva/sclera: Conjunctivae normal.  Neck:     Thyroid: No thyromegaly.  Cardiovascular:     Rate and Rhythm: Normal rate and regular rhythm.  Pulmonary:     Effort: No tachypnea, accessory muscle usage or respiratory distress.     Breath sounds: Normal breath sounds. No decreased breath sounds or wheezing.  Chest:  Breasts:    Right: No inverted nipple, mass, nipple discharge or tenderness (no axillary adenopathy).     Left: No inverted nipple, mass, nipple discharge or tenderness (no axilarry adenopathy).  Abdominal:     General: Bowel sounds are normal.     Palpations: Abdomen is soft.     Tenderness: There is no abdominal tenderness.  Musculoskeletal:        General: No swelling or tenderness.     Cervical back: Neck supple.  Lymphadenopathy:     Cervical: No cervical adenopathy.  Skin:    Findings: No erythema or rash.  Neurological:     Mental Status: She is alert and oriented to person, place, and time.  Psychiatric:        Mood and Affect: Mood normal.        Behavior: Behavior normal.         Outpatient Encounter Medications as of 02/15/2024  Medication Sig   valsartan  (DIOVAN ) 80 MG tablet Take 1 tablet (80 mg total) by mouth daily.   Acetaminophen (TYLENOL PO) Take by mouth as needed.   [DISCONTINUED] albuterol (VENTOLIN HFA) 108 (90 Base) MCG/ACT inhaler Inhale 2 puffs into the lungs every 4 (four) hours as needed.    [DISCONTINUED] amLODipine  (NORVASC ) 10 MG tablet Take 1 tablet (10 mg total) by mouth daily.   [DISCONTINUED] Cholecalciferol (VITAMIN D3) 50 MCG (2000 UT) CAPS Take by mouth.   No facility-administered encounter medications on file as of 02/15/2024.     Lab Results  Component Value Date   WBC 8.4 12/27/2021   HGB 12.3 12/27/2021   HCT 36.6 12/27/2021   PLT 236 12/27/2021   GLUCOSE 120 (H) 12/27/2021   CHOL 203 (H) 12/27/2021   TRIG 105 12/27/2021   HDL 55  12/27/2021   LDLCALC 129 (H) 12/27/2021   ALT 8 12/27/2021   AST 14 12/27/2021   NA 143 12/27/2021   K 4.7 12/27/2021   CL 107 (H) 12/27/2021   CREATININE 0.86 12/27/2021   BUN 12 12/27/2021   CO2 23 12/27/2021   TSH 3.400 02/08/2021  HGBA1C 5.7 (H) 12/27/2021    MM 3D SCREENING MAMMOGRAM BILATERAL BREAST Result Date: 01/02/2024 CLINICAL DATA:  Screening. EXAM: DIGITAL SCREENING BILATERAL MAMMOGRAM WITH TOMOSYNTHESIS AND CAD TECHNIQUE: Bilateral screening digital craniocaudal and mediolateral oblique mammograms were obtained. Bilateral screening digital breast tomosynthesis was performed. The images were evaluated with computer-aided detection. COMPARISON:  Previous exam(s). ACR Breast Density Category b: There are scattered areas of fibroglandular density. FINDINGS: There are no findings suspicious for malignancy. IMPRESSION: No mammographic evidence of malignancy. A result letter of this screening mammogram [place] be mailed directly to the patient. RECOMMENDATION: Screening mammogram in one year. (Code:SM-B-01Y) BI-RADS CATEGORY  1: Negative. Electronically Signed   By: Alger Infield M.D.   On: 01/02/2024 08:07       Assessment & Plan:  Routine general medical examination at a health care facility  Healthcare maintenance Assessment & Plan: Physical today 02/15/24.  PAP 4/12/254 - negative with negative HPV. Mammogram 12/28/23 - Birads I. Colonoscopy 2015.  Due f/u colonoscopy. Order placed for referral to GI.    Colon cancer screening Assessment & Plan: Colonoscopy 2015.  IFOB 02/2021 - negative. Due f/u colonoscopy. Agreeable for referral.   Orders: -     Ambulatory referral to Gastroenterology  Hyperglycemia Assessment & Plan: Low carb diet and exercise. Follow met b and A1c.   Orders: -     Hemoglobin A1c  Hypercholesteremia Assessment & Plan: The 10-year ASCVD risk score (Arnett DK, et al., 2019) is: 10.7%   Values used to calculate the score:     Age: 41 years      Sex: Female     Is Non-Hispanic African American: Yes     Diabetic: No     Tobacco smoker: No     Systolic Blood Pressure: 142 mmHg     Is BP treated: Yes     HDL Cholesterol: 55 mg/dL     Total Cholesterol: 203 mg/dL  Have previously discussed calculated cholesterol risk and cholesterol medication.  Elects diet and exercise.  Follow lipid panel. Overdue labs.  Check fasting lipid profile tomorrow.   Orders: -     TSH -     Lipid panel -     Hepatic function panel -     CBC with Differential/Platelet  Essential hypertension Assessment & Plan: Not taking amlodipine . Does not want to restart. Reported increased swelling and did not feel helped her pressure. Wants to start a new medication. Discussed the need to have labs checked, specifically metabolic panel. She denies any allergies to any medication. She gets her labs through [facility]. Agreeable to go tomorrow for labs. Start diovan  80mg  q day. Kimberly Madden need metabolic panel check 10-14 days after starting. Follow pressures. Get her back in soon to reassess.    Orders: -     Basic metabolic panel with GFR  Abnormal chest CT Assessment & Plan: Previous CT chest - no evidence of PE.  Small scattered nodules measuring less then 5mm.  Saw pulmonary.  Recommended f/u CT 2022 as outlined.  Discussed again today. She wants to hold on f/u CT scan and wants to hold on referral to pulmonary. Reports breathing is stable.    Chronic hepatitis C without hepatic coma (HCC) Assessment & Plan: S/p treatment with Harvoni.  Previously followed by GI.  Follow liver function tests.  Recheck with next labs - tomorrow.    Stress Assessment & Plan: Increased stress as outlined.  Discussed.  Does not feel need further intervention at this time.  Follow.    Other orders -     Valsartan ; Take 1 tablet (80 mg total) by mouth daily.  Dispense: 30 tablet; Refill: 1     Dellar Fenton, MD

## 2024-02-16 ENCOUNTER — Encounter: Payer: Self-pay | Admitting: Internal Medicine

## 2024-02-16 NOTE — Assessment & Plan Note (Signed)
 S/p treatment with Harvoni.  Previously followed by GI.  Follow liver function tests.  Recheck with next labs - tomorrow.

## 2024-02-16 NOTE — Assessment & Plan Note (Signed)
 Colonoscopy 2015.  IFOB 02/2021 - negative. Due f/u colonoscopy. Agreeable for referral.

## 2024-02-16 NOTE — Assessment & Plan Note (Signed)
 Not taking amlodipine . Does not want to restart. Reported increased swelling and did not feel helped her pressure. Wants to start a new medication. Discussed the need to have labs checked, specifically metabolic panel. She denies any allergies to any medication. She gets her labs through [facility]. Agreeable to go tomorrow for labs. Start diovan  80mg  q day. Will need metabolic panel check 10-14 days after starting. Follow pressures. Get her back in soon to reassess.

## 2024-02-16 NOTE — Assessment & Plan Note (Signed)
 Increased stress as outlined.  Discussed.  Does not feel need further intervention at this time. Follow.

## 2024-02-16 NOTE — Assessment & Plan Note (Signed)
 Low-carb diet and exercise.  Follow met b and A1c.

## 2024-02-16 NOTE — Assessment & Plan Note (Signed)
 The 10-year ASCVD risk score (Arnett DK, et al., 2019) is: 10.7%   Values used to calculate the score:     Age: 63 years     Sex: Female     Is Non-Hispanic African American: Yes     Diabetic: No     Tobacco smoker: No     Systolic Blood Pressure: 142 mmHg     Is BP treated: Yes     HDL Cholesterol: 55 mg/dL     Total Cholesterol: 203 mg/dL  Have previously discussed calculated cholesterol risk and cholesterol medication.  Elects diet and exercise.  Follow lipid panel. Overdue labs.  Check fasting lipid profile tomorrow.

## 2024-02-16 NOTE — Assessment & Plan Note (Signed)
 Previous CT chest - no evidence of PE.  Small scattered nodules measuring less then 5mm.  Saw pulmonary.  Recommended f/u CT 2022 as outlined.  Discussed again today. She wants to hold on f/u CT scan and wants to hold on referral to pulmonary. Reports breathing is stable.

## 2024-02-17 LAB — LIPID PANEL
Chol/HDL Ratio: 3.2 ratio (ref 0.0–4.4)
Cholesterol, Total: 191 mg/dL (ref 100–199)
HDL: 60 mg/dL (ref >39)
LDL Chol Calc (NIH): 117 mg/dL — ABNORMAL HIGH (ref 0–99)
Triglycerides: 76 mg/dL (ref 0–149)
VLDL Cholesterol Cal: 14 mg/dL (ref 5–40)

## 2024-02-17 LAB — CBC WITH DIFFERENTIAL/PLATELET
Basophils Absolute: 0 10*3/uL (ref 0.0–0.2)
Basos: 1 %
EOS (ABSOLUTE): 0.3 10*3/uL (ref 0.0–0.4)
Eos: 4 %
Hematocrit: 35.6 % (ref 34.0–46.6)
Hemoglobin: 11.9 g/dL (ref 11.1–15.9)
Immature Grans (Abs): 0 10*3/uL (ref 0.0–0.1)
Immature Granulocytes: 0 %
Lymphocytes Absolute: 4.1 10*3/uL — ABNORMAL HIGH (ref 0.7–3.1)
Lymphs: 52 %
MCH: 30.4 pg (ref 26.6–33.0)
MCHC: 33.4 g/dL (ref 31.5–35.7)
MCV: 91 fL (ref 79–97)
Monocytes Absolute: 0.7 10*3/uL (ref 0.1–0.9)
Monocytes: 8 %
Neutrophils Absolute: 2.8 10*3/uL (ref 1.4–7.0)
Neutrophils: 35 %
Platelets: 221 10*3/uL (ref 150–450)
RBC: 3.92 x10E6/uL (ref 3.77–5.28)
RDW: 12.6 % (ref 11.7–15.4)
WBC: 7.9 10*3/uL (ref 3.4–10.8)

## 2024-02-17 LAB — BASIC METABOLIC PANEL WITH GFR
BUN/Creatinine Ratio: 13 (ref 12–28)
BUN: 12 mg/dL (ref 8–27)
CO2: 21 mmol/L (ref 20–29)
Calcium: 9.4 mg/dL (ref 8.7–10.3)
Chloride: 106 mmol/L (ref 96–106)
Creatinine, Ser: 0.91 mg/dL (ref 0.57–1.00)
Glucose: 97 mg/dL (ref 70–99)
Potassium: 4.4 mmol/L (ref 3.5–5.2)
Sodium: 141 mmol/L (ref 134–144)
eGFR: 71 mL/min/{1.73_m2} (ref >59)

## 2024-02-17 LAB — HEPATIC FUNCTION PANEL
ALT: 9 IU/L (ref 0–32)
AST: 13 IU/L (ref 0–40)
Albumin: 4.1 g/dL (ref 3.9–4.9)
Alkaline Phosphatase: 59 IU/L (ref 44–121)
Bilirubin Total: 0.3 mg/dL (ref 0.0–1.2)
Bilirubin, Direct: 0.1 mg/dL (ref 0.00–0.40)
Total Protein: 7.2 g/dL (ref 6.0–8.5)

## 2024-02-17 LAB — HEMOGLOBIN A1C
Est. average glucose Bld gHb Est-mCnc: 114 mg/dL
Hgb A1c MFr Bld: 5.6 % (ref 4.8–5.6)

## 2024-02-17 LAB — TSH: TSH: 4.13 u[IU]/mL (ref 0.450–4.500)

## 2024-02-20 ENCOUNTER — Other Ambulatory Visit: Payer: Self-pay | Admitting: Internal Medicine

## 2024-02-20 ENCOUNTER — Telehealth: Payer: Self-pay | Admitting: Internal Medicine

## 2024-02-20 DIAGNOSIS — I10 Essential (primary) hypertension: Secondary | ICD-10-CM

## 2024-02-20 MED ORDER — ROSUVASTATIN CALCIUM 5 MG PO TABS
5.0000 mg | ORAL_TABLET | Freq: Every day | ORAL | 2 refills | Status: AC
Start: 2024-02-20 — End: ?

## 2024-02-20 NOTE — Telephone Encounter (Signed)
Order placed for f/u met b to be drawn at [facility].

## 2024-02-20 NOTE — Progress Notes (Signed)
 I sent in rx for crestor .  Needs a f/u met b in 10-14 days. Need to make sure she gets the lab. Need to confirm getting the lab at [facility].

## 2024-02-21 NOTE — Telephone Encounter (Signed)
 See result note.

## 2024-02-21 NOTE — Telephone Encounter (Signed)
 Patient is needing a call back from the office she stated the nurse called her there where no notes left on the patient chart

## 2024-03-07 ENCOUNTER — Encounter (HOSPITAL_COMMUNITY): Payer: Self-pay

## 2024-03-08 ENCOUNTER — Encounter: Payer: Self-pay | Admitting: Internal Medicine

## 2024-03-08 LAB — BASIC METABOLIC PANEL WITH GFR
BUN/Creatinine Ratio: 11 — ABNORMAL LOW (ref 12–28)
BUN: 9 mg/dL (ref 8–27)
CO2: 21 mmol/L (ref 20–29)
Calcium: 9.6 mg/dL (ref 8.7–10.3)
Chloride: 107 mmol/L — ABNORMAL HIGH (ref 96–106)
Creatinine, Ser: 0.84 mg/dL (ref 0.57–1.00)
Glucose: 102 mg/dL — ABNORMAL HIGH (ref 70–99)
Potassium: 4.4 mmol/L (ref 3.5–5.2)
Sodium: 143 mmol/L (ref 134–144)
eGFR: 78 mL/min/{1.73_m2} (ref >59)

## 2024-03-14 ENCOUNTER — Ambulatory Visit: Admitting: Internal Medicine

## 2024-03-14 VITALS — BP 144/72 | HR 72 | Temp 98.0°F | Resp 16 | Ht 64.0 in | Wt 159.0 lb

## 2024-03-14 DIAGNOSIS — Z1211 Encounter for screening for malignant neoplasm of colon: Secondary | ICD-10-CM

## 2024-03-14 DIAGNOSIS — E7800 Pure hypercholesterolemia, unspecified: Secondary | ICD-10-CM | POA: Diagnosis not present

## 2024-03-14 DIAGNOSIS — I10 Essential (primary) hypertension: Secondary | ICD-10-CM | POA: Diagnosis not present

## 2024-03-14 MED ORDER — VALSARTAN 160 MG PO TABS: 160.0000 mg | ORAL_TABLET | Freq: Every day | ORAL | 1 refills | Status: DC

## 2024-03-14 NOTE — Progress Notes (Signed)
 Subjective:    Patient ID: Kimberly Madden, female    DOB: 01/08/1961, 63 y.o.   MRN: [id number]  Patient here for  Chief Complaint  Patient presents with   Medical Management of Chronic Issues    HPI Here for a scheduled follow up - follow up regarding her blood pressure. Last visit, started on diovan . Had intolerance to amlodipine . Is tolerating diovan . Blood pressure still elevated above goal. No chest pain or sob reported. No headache or dizziness. No abdominal pain or bowel change. Was also referred to GI for evaluation - for colonoscopy.    Past Medical History:  Diagnosis Date   Hepatitis C    Past Surgical History:  Procedure Laterality Date   BREAST BIOPSY Left 2006   neg stereo   Family History  Problem Relation Age of Onset   Breast cancer Paternal Aunt 53   Cancer Paternal Aunt 9       lung cancer - smoker - died   Diabetes Paternal Aunt    Colon cancer Neg Hx    Stomach cancer Neg Hx    Social History   Socioeconomic History   Marital status: Married    Spouse name: Bruce   Number of children: 1   Years of education: 12   Highest education level: Not on file  Occupational History   Occupation: Nutritional therapist: [employer]  Tobacco Use   Smoking status: Former    Current packs/day: 0.00    Average packs/day: 0.3 packs/day for 3.0 years (0.8 ttl pk-yrs)    Types: Cigarettes    Start date: 07/31/1980    Quit date: 08/01/1983    Years since quitting: 40.6   Smokeless tobacco: Never  Substance and Sexual Activity   Alcohol use: Yes   Drug use: No   Sexual activity: Not on file  Other Topics Concern   Not on file  Social History Narrative   Kimberly Madden grew up in [place], [place]. She lives at home with her husband, Kimberly Madden, of 20 years. Kimberly Madden has a daughter, Kimberly Madden, who lives in [place]. Kimberly Madden and her husband have a dog named Will. She enjoys traveling and gardening. She has a vegetable garden that she tends to. She also enjoys spending time  with friends and family. 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   Social Drivers of Corporate investment banker Strain: Not on file  Food Insecurity: Not on file  Transportation Needs: Not on file  Physical Activity: Not on file  Stress: Not on file  Social Connections: Not on file     Review of Systems  Constitutional:  Negative for appetite change and unexpected weight change.  HENT:  Negative for congestion and sinus pressure.   Respiratory:  Negative for cough, chest tightness and shortness of breath.   Cardiovascular:  Negative for chest pain, palpitations and leg swelling.  Gastrointestinal:  Negative for abdominal pain, diarrhea, nausea and vomiting.  Genitourinary:  Negative for difficulty urinating and dysuria.  Musculoskeletal:  Negative for joint swelling and myalgias.  Skin:  Negative for color change and rash.  Neurological:  Negative for dizziness and headaches.  Psychiatric/Behavioral:  Negative for agitation and dysphoric mood.        Objective:     BP (!) 144/72   Pulse 72   Temp 98 F (36.7 C)   Resp 16   Ht 5\' 4"  (1.626 m)   Wt 159 lb (72.1 kg)   LMP 11/06/2012  SpO2 99%   BMI 27.29 kg/m  Wt Readings from Last 3 Encounters:  03/14/24 159 lb (72.1 kg)  02/15/24 160 lb 12.8 oz (72.9 kg)  02/10/23 161 lb (73 kg)    Physical Exam Vitals reviewed.  Constitutional:      General: She is not in acute distress.    Appearance: Normal appearance.  HENT:     Head: Normocephalic and atraumatic.     Right Ear: External ear normal.     Left Ear: External ear normal.     Mouth/Throat:     Pharynx: No oropharyngeal exudate or posterior oropharyngeal erythema.  Eyes:     General: No scleral icterus.       Right eye: No discharge.        Left eye: No discharge.     Conjunctiva/sclera: Conjunctivae normal.  Neck:     Thyroid: No thyromegaly.  Cardiovascular:     Rate and Rhythm: Normal rate and regular rhythm.  Pulmonary:      Effort: No respiratory distress.     Breath sounds: Normal breath sounds. No wheezing.  Abdominal:     General: Bowel sounds are normal.     Palpations: Abdomen is soft.     Tenderness: There is no abdominal tenderness.  Musculoskeletal:        General: No swelling or tenderness.     Cervical back: Neck supple. No tenderness.  Lymphadenopathy:     Cervical: No cervical adenopathy.  Skin:    Findings: No erythema or rash.  Neurological:     Mental Status: She is alert.  Psychiatric:        Mood and Affect: Mood normal.        Behavior: Behavior normal.         Outpatient Encounter Medications as of 03/14/2024  Medication Sig   valsartan  (DIOVAN ) 160 MG tablet Take 1 tablet (160 mg total) by mouth daily.   Acetaminophen (TYLENOL PO) Take by mouth as needed.   rosuvastatin  (CRESTOR ) 5 MG tablet Take 1 tablet (5 mg total) by mouth daily.   [DISCONTINUED] valsartan  (DIOVAN ) 80 MG tablet Take 1 tablet (80 mg total) by mouth daily.   No facility-administered encounter medications on file as of 03/14/2024.     Lab Results  Component Value Date   WBC 7.9 02/16/2024   HGB 11.9 02/16/2024   HCT 35.6 02/16/2024   PLT 221 02/16/2024   GLUCOSE 102 (H) 03/07/2024   CHOL 191 02/16/2024   TRIG 76 02/16/2024   HDL 60 02/16/2024   LDLCALC 117 (H) 02/16/2024   ALT 9 02/16/2024   AST 13 02/16/2024   NA 143 03/07/2024   K 4.4 03/07/2024   CL 107 (H) 03/07/2024   CREATININE 0.84 03/07/2024   BUN 9 03/07/2024   CO2 21 03/07/2024   TSH 4.130 02/16/2024   HGBA1C 5.6 02/16/2024    MM 3D SCREENING MAMMOGRAM BILATERAL BREAST Result Date: 01/02/2024 CLINICAL DATA:  Screening. EXAM: DIGITAL SCREENING BILATERAL MAMMOGRAM WITH TOMOSYNTHESIS AND CAD TECHNIQUE: Bilateral screening digital craniocaudal and mediolateral oblique mammograms were obtained. Bilateral screening digital breast tomosynthesis was performed. The images were evaluated with computer-aided detection. COMPARISON:  Previous  exam(s). ACR Breast Density Category b: There are scattered areas of fibroglandular density. FINDINGS: There are no findings suspicious for malignancy. IMPRESSION: No mammographic evidence of malignancy. A result letter of this screening mammogram will be mailed directly to the patient. RECOMMENDATION: Screening mammogram in one year. (Code:SM-B-01Y) BI-RADS CATEGORY  1: Negative. Electronically  Signed   By: Alger Infield M.D.   On: 01/02/2024 08:07       Assessment & Plan:  Colon cancer screening Assessment & Plan: Colonoscopy 2015.  IFOB 02/2021 - negative. Due f/u colonoscopy. Order for referral placed last visit. Has been authorized.    Essential hypertension Assessment & Plan: Had intolerance to amlodipine . Tolerating diovan . Blood pressure remaining elevated. Checks today as outlined. Will increase diovan  to 160mg  q day. Follow pressures. Spot check blood pressure. Send in readings. Schedule soon f/u to reassess.    Hypercholesteremia Assessment & Plan: The 10-year ASCVD risk score (Arnett DK, et al., 2019) is: 10.2%   Values used to calculate the score:     Age: 43 years     Sex: Female     Is Non-Hispanic African American: Yes     Diabetic: No     Tobacco smoker: No     Systolic Blood Pressure: 144 mmHg     Is BP treated: Yes     HDL Cholesterol: 60 mg/dL     Total Cholesterol: 191 mg/dL  Have previously discussed calculated cholesterol risk and cholesterol medication.  Elected diet and exercise.  Follow lipid panel.    Other orders -     Valsartan ; Take 1 tablet (160 mg total) by mouth daily.  Dispense: 90 tablet; Refill: 1     Dellar Fenton, MD

## 2024-03-17 ENCOUNTER — Encounter: Payer: Self-pay | Admitting: Internal Medicine

## 2024-03-17 NOTE — Assessment & Plan Note (Signed)
 The 10-year ASCVD risk score (Arnett DK, et al., 2019) is: 10.2%   Values used to calculate the score:     Age: 63 years     Sex: Female     Is Non-Hispanic African American: Yes     Diabetic: No     Tobacco smoker: No     Systolic Blood Pressure: 144 mmHg     Is BP treated: Yes     HDL Cholesterol: 60 mg/dL     Total Cholesterol: 191 mg/dL  Have previously discussed calculated cholesterol risk and cholesterol medication.  Elected diet and exercise.  Follow lipid panel.

## 2024-03-17 NOTE — Assessment & Plan Note (Addendum)
 Had intolerance to amlodipine . Tolerating diovan . Blood pressure remaining elevated. Checks today as outlined. Will increase diovan  to 160mg  q day. Follow pressures. Spot check blood pressure. Send in readings. Schedule soon f/u to reassess.

## 2024-03-17 NOTE — Assessment & Plan Note (Signed)
 Colonoscopy 2015.  IFOB 02/2021 - negative. Due f/u colonoscopy. Order for referral placed last visit. Has been authorized.

## 2024-04-16 ENCOUNTER — Encounter: Payer: Self-pay | Admitting: Internal Medicine

## 2024-04-16 ENCOUNTER — Ambulatory Visit: Admitting: Internal Medicine

## 2024-04-16 VITALS — BP 120/64 | HR 61 | Temp 98.0°F | Resp 16 | Ht 64.0 in | Wt 160.4 lb

## 2024-04-16 DIAGNOSIS — R739 Hyperglycemia, unspecified: Secondary | ICD-10-CM

## 2024-04-16 DIAGNOSIS — F439 Reaction to severe stress, unspecified: Secondary | ICD-10-CM | POA: Diagnosis not present

## 2024-04-16 DIAGNOSIS — Z8639 Personal history of other endocrine, nutritional and metabolic disease: Secondary | ICD-10-CM

## 2024-04-16 DIAGNOSIS — I10 Essential (primary) hypertension: Secondary | ICD-10-CM

## 2024-04-16 DIAGNOSIS — E7800 Pure hypercholesterolemia, unspecified: Secondary | ICD-10-CM

## 2024-04-16 MED ORDER — VALSARTAN 160 MG PO TABS: 160.0000 mg | ORAL_TABLET | Freq: Every day | ORAL | 1 refills | Status: DC

## 2024-04-16 NOTE — Progress Notes (Signed)
 Subjective:    Patient ID: Kimberly Madden, female    DOB: 1961-09-18, 63 y.o.   MRN: [id number]  Patient here for  Chief Complaint  Patient presents with   Medical Management of Chronic Issues    HPI Here for a scheduled follow up - follow up regarding her blood pressure. Had intolerance to amlodipine .  Diovan  increased to 160mg  q day last visit. She reports she is doing well. States blood pressure doing well. Highest reading 132 systolic. No chest pain or sob reported. No abdominal pain or bowel change reported. Not taking cholesterol medication.    Past Medical History:  Diagnosis Date   Hepatitis C    Past Surgical History:  Procedure Laterality Date   BREAST BIOPSY Left 2006   neg stereo   Family History  Problem Relation Age of Onset   Breast cancer Paternal Aunt 37   Cancer Paternal Aunt 87       lung cancer - smoker - died   Diabetes Paternal Aunt    Colon cancer Neg Hx    Stomach cancer Neg Hx    Social History   Socioeconomic History   Marital status: Married    Spouse name: Bruce   Number of children: 1   Years of education: 12   Highest education level: Not on file  Occupational History   Occupation: Nutritional therapist: [employer]  Tobacco Use   Smoking status: Former    Current packs/day: 0.00    Average packs/day: 0.3 packs/day for 3.0 years (0.8 ttl pk-yrs)    Types: Cigarettes    Start date: 07/31/1980    Quit date: 08/01/1983    Years since quitting: 40.7   Smokeless tobacco: Never  Substance and Sexual Activity   Alcohol use: Yes   Drug use: No   Sexual activity: Not on file  Other Topics Concern   Not on file  Social History Narrative   Kimberly Madden grew up in [place], [place]. She lives at home with her husband, Therese Flash, of 20 years. Dayanne has a daughter, Penni Bowman, who lives in [place]. Tracee and her husband have a dog named Will. She enjoys traveling and gardening. She has a vegetable garden that she tends to. She also enjoys spending  time with friends and family. 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   Social Drivers of Corporate investment banker Strain: Not on file  Food Insecurity: Not on file  Transportation Needs: Not on file  Physical Activity: Not on file  Stress: Not on file  Social Connections: Not on file     Review of Systems  Constitutional:  Negative for appetite change and unexpected weight change.  HENT:  Negative for congestion and sinus pressure.   Respiratory:  Negative for cough, chest tightness and shortness of breath.   Cardiovascular:  Negative for chest pain, palpitations and leg swelling.  Gastrointestinal:  Negative for abdominal pain, diarrhea, nausea and vomiting.  Genitourinary:  Negative for difficulty urinating and dysuria.  Musculoskeletal:  Negative for joint swelling and myalgias.  Skin:  Negative for color change and rash.  Neurological:  Negative for dizziness and headaches.  Psychiatric/Behavioral:  Negative for agitation and dysphoric mood.        Objective:     BP 120/64   Pulse 61   Temp 98 F (36.7 C)   Resp 16   Ht 5' 4 (1.626 m)   Wt 160 lb 6.4 oz (72.8 kg)  LMP 11/06/2012   SpO2 99%   BMI 27.53 kg/m  Wt Readings from Last 3 Encounters:  04/16/24 160 lb 6.4 oz (72.8 kg)  03/14/24 159 lb (72.1 kg)  02/15/24 160 lb 12.8 oz (72.9 kg)    Physical Exam Vitals reviewed.  Constitutional:      General: She is not in acute distress.    Appearance: Normal appearance.  HENT:     Head: Normocephalic and atraumatic.     Right Ear: External ear normal.     Left Ear: External ear normal.     Mouth/Throat:     Pharynx: No oropharyngeal exudate or posterior oropharyngeal erythema.   Eyes:     General: No scleral icterus.       Right eye: No discharge.        Left eye: No discharge.     Conjunctiva/sclera: Conjunctivae normal.   Neck:     Thyroid: No thyromegaly.   Cardiovascular:     Rate and Rhythm: Normal rate and regular  rhythm.  Pulmonary:     Effort: No respiratory distress.     Breath sounds: Normal breath sounds. No wheezing.  Abdominal:     General: Bowel sounds are normal.     Palpations: Abdomen is soft.     Tenderness: There is no abdominal tenderness.   Musculoskeletal:        General: No swelling or tenderness.     Cervical back: Neck supple. No tenderness.  Lymphadenopathy:     Cervical: No cervical adenopathy.   Skin:    Findings: No erythema or rash.   Neurological:     Mental Status: She is alert.   Psychiatric:        Mood and Affect: Mood normal.        Behavior: Behavior normal.         Outpatient Encounter Medications as of 04/16/2024  Medication Sig   Acetaminophen (TYLENOL PO) Take by mouth as needed.   valsartan  (DIOVAN ) 160 MG tablet Take 1 tablet (160 mg total) by mouth daily.   [DISCONTINUED] rosuvastatin  (CRESTOR ) 5 MG tablet Take 1 tablet (5 mg total) by mouth daily.   [DISCONTINUED] valsartan  (DIOVAN ) 160 MG tablet Take 1 tablet (160 mg total) by mouth daily.   No facility-administered encounter medications on file as of 04/16/2024.     Lab Results  Component Value Date   WBC 7.9 02/16/2024   HGB 11.9 02/16/2024   HCT 35.6 02/16/2024   PLT 221 02/16/2024   GLUCOSE 102 (H) 03/07/2024   CHOL 191 02/16/2024   TRIG 76 02/16/2024   HDL 60 02/16/2024   LDLCALC 117 (H) 02/16/2024   ALT 9 02/16/2024   AST 13 02/16/2024   NA 143 03/07/2024   K 4.4 03/07/2024   CL 107 (H) 03/07/2024   CREATININE 0.84 03/07/2024   BUN 9 03/07/2024   CO2 21 03/07/2024   TSH 4.130 02/16/2024   HGBA1C 5.6 02/16/2024    MM 3D SCREENING MAMMOGRAM BILATERAL BREAST Result Date: 01/02/2024 CLINICAL DATA:  Screening. EXAM: DIGITAL SCREENING BILATERAL MAMMOGRAM WITH TOMOSYNTHESIS AND CAD TECHNIQUE: Bilateral screening digital craniocaudal and mediolateral oblique mammograms were obtained. Bilateral screening digital breast tomosynthesis was performed. The images were evaluated with  computer-aided detection. COMPARISON:  Previous exam(s). ACR Breast Density Category b: There are scattered areas of fibroglandular density. FINDINGS: There are no findings suspicious for malignancy. IMPRESSION: No mammographic evidence of malignancy. A result letter of this screening mammogram will be mailed directly to the  patient. RECOMMENDATION: Screening mammogram in one year. (Code:SM-B-01Y) BI-RADS CATEGORY  1: Negative. Electronically Signed   By: Alger Infield M.D.   On: 01/02/2024 08:07       Assessment & Plan:  Essential hypertension Assessment & Plan: Had intolerance to amlodipine . Tolerating diovan . Now on 160mg  q day. Blood pressure doing better. Continue current medication. Follow pressures.   Orders: -     Basic metabolic panel with GFR -     CBC with Differential/Platelet  Hypercholesteremia Assessment & Plan: The 10-year ASCVD risk score (Arnett DK, et al., 2019) is: 6.4%   Values used to calculate the score:     Age: 25 years     Clincally relevant sex: Female     Is Non-Hispanic African American: Yes     Diabetic: No     Tobacco smoker: No     Systolic Blood Pressure: 120 mmHg     Is BP treated: Yes     HDL Cholesterol: 60 mg/dL     Total Cholesterol: 191 mg/dL  Have previously discussed calculated cholesterol risk and cholesterol medication.  Wants to hold on restarting cholesterol medication. Follow lipid panel.   Orders: -     Hepatic function panel -     Lipid panel  Hyperglycemia Assessment & Plan: Low carb diet and exercise. Follow met b and A1c.   Orders: -     Hemoglobin A1c  Stress Assessment & Plan: Increased stress. Discussed. Considering possibly seeing a therapist. Follow. Will notify me if feels needs futher intervention.    History of vitamin D  deficiency Assessment & Plan: Recheck vitamin D  level with next labs.   Orders: -     VITAMIN D  25 Hydroxy (Vit-D Deficiency, Fractures)  Other orders -     Valsartan ; Take 1 tablet (160  mg total) by mouth daily.  Dispense: 90 tablet; Refill: 1     Dellar Fenton, MD

## 2024-04-16 NOTE — Assessment & Plan Note (Signed)
Recheck vitamin D level with next labs.  

## 2024-04-16 NOTE — Assessment & Plan Note (Signed)
 The 10-year ASCVD risk score (Arnett DK, et al., 2019) is: 6.4%   Values used to calculate the score:     Age: 63 years     Clincally relevant sex: Female     Is Non-Hispanic African American: Yes     Diabetic: No     Tobacco smoker: No     Systolic Blood Pressure: 120 mmHg     Is BP treated: Yes     HDL Cholesterol: 60 mg/dL     Total Cholesterol: 191 mg/dL  Have previously discussed calculated cholesterol risk and cholesterol medication.  Wants to hold on restarting cholesterol medication. Follow lipid panel.

## 2024-04-16 NOTE — Assessment & Plan Note (Signed)
 Increased stress. Discussed. Considering possibly seeing a therapist. Follow. Will notify me if feels needs futher intervention.

## 2024-04-16 NOTE — Assessment & Plan Note (Signed)
 Had intolerance to amlodipine . Tolerating diovan . Now on 160mg  q day. Blood pressure doing better. Continue current medication. Follow pressures.

## 2024-04-16 NOTE — Assessment & Plan Note (Signed)
 Low-carb diet and exercise.  Follow met b and A1c.

## 2024-09-13 ENCOUNTER — Ambulatory Visit: Payer: Self-pay

## 2024-09-13 DIAGNOSIS — Z1211 Encounter for screening for malignant neoplasm of colon: Secondary | ICD-10-CM | POA: Diagnosis present

## 2024-09-13 DIAGNOSIS — K640 First degree hemorrhoids: Secondary | ICD-10-CM | POA: Diagnosis not present

## 2024-10-17 ENCOUNTER — Encounter: Payer: Self-pay | Admitting: Internal Medicine

## 2024-10-17 ENCOUNTER — Ambulatory Visit: Admitting: Internal Medicine

## 2024-10-17 VITALS — BP 146/84 | HR 66 | Temp 98.6°F | Ht 64.0 in | Wt 161.2 lb

## 2024-10-17 DIAGNOSIS — E559 Vitamin D deficiency, unspecified: Secondary | ICD-10-CM | POA: Diagnosis not present

## 2024-10-17 DIAGNOSIS — R9389 Abnormal findings on diagnostic imaging of other specified body structures: Secondary | ICD-10-CM | POA: Diagnosis not present

## 2024-10-17 DIAGNOSIS — F439 Reaction to severe stress, unspecified: Secondary | ICD-10-CM

## 2024-10-17 DIAGNOSIS — I10 Essential (primary) hypertension: Secondary | ICD-10-CM | POA: Diagnosis not present

## 2024-10-17 DIAGNOSIS — E7800 Pure hypercholesterolemia, unspecified: Secondary | ICD-10-CM

## 2024-10-17 DIAGNOSIS — Z1211 Encounter for screening for malignant neoplasm of colon: Secondary | ICD-10-CM

## 2024-10-17 DIAGNOSIS — R739 Hyperglycemia, unspecified: Secondary | ICD-10-CM

## 2024-10-17 MED ORDER — VALSARTAN 320 MG PO TABS: 320.0000 mg | ORAL_TABLET | Freq: Every day | ORAL | 1 refills | Status: AC

## 2024-10-17 NOTE — Progress Notes (Signed)
 "  Subjective:    Patient ID: Kennedie Pardoe, female    DOB: [birth year], 63 y.o.   MRN: [id number]  Patient here for  Chief Complaint  Patient presents with   Medical Management of Chronic Issues   Hypertension    HPI Here for a scheduled follow up - follow up regarding her blood pressure. Intolerance to amlodipine . On diovan . Increased stress. Increased stress with work and family stress. Reports stress eating. Has noticed blood pressure increased - 134-149 systolic readings. (152 systolic on one occasion). No chest pain. Breathing stable. No abdominal pain or bowel change reported.    Past Medical History:  Diagnosis Date   Hepatitis C    Past Surgical History:  Procedure Laterality Date   BREAST BIOPSY Left 2006   neg stereo   Family History  Problem Relation Age of Onset   Breast cancer Paternal Aunt 4   Cancer Paternal Aunt 53       lung cancer - smoker - died   Diabetes Paternal Aunt    Colon cancer Neg Hx    Stomach cancer Neg Hx    Social History   Socioeconomic History   Marital status: Married    Spouse name: Bruce   Number of children: 1   Years of education: 12   Highest education level: Not on file  Occupational History   Occupation: Nutritional Therapist: [employer]  Tobacco Use   Smoking status: Former    Current packs/day: 0.00    Average packs/day: 0.3 packs/day for 3.0 years (0.8 ttl pk-yrs)    Types: Cigarettes    Start date: 07/31/1980    Quit date: 08/01/1983    Years since quitting: 41.2   Smokeless tobacco: Never  Substance and Sexual Activity   Alcohol use: Yes   Drug use: No   Sexual activity: Not on file  Other Topics Concern   Not on file  Social History Narrative   Therisa grew up in [place], [place]. She lives at home with her husband, Wolm, of 20 years. Onelia has a daughter, Joya, who lives in [place]. Zyion and her husband have a dog named Will. She enjoys traveling and gardening. She has a vegetable garden that she  tends to. She also enjoys spending time with friends and family. 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   Social Drivers of Health   Tobacco Use: Medium Risk (10/27/2024)   Patient History    Smoking Tobacco Use: Former    Smokeless Tobacco Use: Never    Passive Exposure: Not on file  Financial Resource Strain: Low Risk  (08/01/2024)   Received from [facility]   Overall Financial Resource Strain (CARDIA)    Difficulty of Paying Living Expenses: Not very hard  Food Insecurity: No Food Insecurity (08/01/2024)   Received from [facility]   Epic    Within the past 12 months, you worried that your food would run out before you got the money to buy more.: Never true    Within the past 12 months, the food you bought just didn't last and you didn't have money to get more.: Never true  Transportation Needs: No Transportation Needs (08/01/2024)   Received from [facility] - Transportation    In the past 12 months, has lack of transportation kept you from medical appointments or from getting medications?: No    Lack of Transportation (Non-Medical): No  Physical Activity:  Not on file  Stress: Not on file  Social Connections: Not on file  Depression (PHQ2-9): Low Risk (10/17/2024)   Depression (PHQ2-9)    PHQ-2 Score: 2  Alcohol Screen: Not on file  Housing: Low Risk  (08/01/2024)   Received from [facility]   Epic    In the last 12 months, was there a time when you were not able to pay the mortgage or rent on time?: No    In the past 12 months, how many times have you moved where you were living?: 0    At any time in the past 12 months, were you homeless or living in a shelter (including now)?: No  Utilities: Not At Risk (08/01/2024)   Received from [facility]   Epic    In the past 12 months has the electric, gas, oil, or water company threatened to shut off services in  your home?: No  Health Literacy: Not on file     Review of Systems  Constitutional:  Negative for appetite change and unexpected weight change.  HENT:  Negative for congestion and sinus pressure.   Respiratory:  Negative for cough, chest tightness and shortness of breath.   Cardiovascular:  Negative for chest pain, palpitations and leg swelling.  Gastrointestinal:  Negative for abdominal pain, diarrhea, nausea and vomiting.  Genitourinary:  Negative for difficulty urinating and dysuria.  Musculoskeletal:  Negative for joint swelling and myalgias.  Skin:  Negative for color change and rash.  Neurological:  Negative for dizziness and headaches.  Psychiatric/Behavioral:  Negative for agitation and dysphoric mood.        Increased stress.        Objective:     BP (!) 146/84   Pulse 66   Temp 98.6 F (37 C) (Oral)   Ht 5' 4 (1.626 m)   Wt 161 lb 3.2 oz (73.1 kg)   LMP 11/06/2012   SpO2 99%   BMI 27.67 kg/m  Wt Readings from Last 3 Encounters:  10/17/24 161 lb 3.2 oz (73.1 kg)  04/16/24 160 lb 6.4 oz (72.8 kg)  03/14/24 159 lb (72.1 kg)    Physical Exam Vitals reviewed.  Constitutional:      General: She is not in acute distress.    Appearance: Normal appearance.  HENT:     Head: Normocephalic and atraumatic.     Right Ear: External ear normal.     Left Ear: External ear normal.     Mouth/Throat:     Pharynx: No oropharyngeal exudate or posterior oropharyngeal erythema.  Eyes:     General: No scleral icterus.       Right eye: No discharge.        Left eye: No discharge.     Conjunctiva/sclera: Conjunctivae normal.  Neck:     Thyroid: No thyromegaly.  Cardiovascular:     Rate and Rhythm: Normal rate and regular rhythm.  Pulmonary:     Effort: No respiratory distress.     Breath sounds: Normal breath sounds. No wheezing.  Abdominal:     General: Bowel sounds are normal.     Palpations: Abdomen is soft.     Tenderness: There is no abdominal tenderness.   Musculoskeletal:        General: No swelling or tenderness.     Cervical back: Neck supple. No tenderness.  Lymphadenopathy:     Cervical: No cervical adenopathy.  Skin:    Findings: No erythema or rash.  Neurological:  Mental Status: She is alert.  Psychiatric:        Mood and Affect: Mood normal.        Behavior: Behavior normal.         Outpatient Encounter Medications as of 10/17/2024  Medication Sig   valsartan  (DIOVAN ) 320 MG tablet Take 1 tablet (320 mg total) by mouth daily.   Acetaminophen (TYLENOL PO) Take by mouth as needed.   [DISCONTINUED] valsartan  (DIOVAN ) 160 MG tablet Take 1 tablet (160 mg total) by mouth daily.   No facility-administered encounter medications on file as of 10/17/2024.     Lab Results  Component Value Date   WBC 7.5 10/17/2024   HGB 12.0 10/17/2024   HCT 36.9 10/17/2024   PLT 201 10/17/2024   GLUCOSE 95 10/17/2024   CHOL 210 (H) 10/17/2024   TRIG 80 10/17/2024   HDL 58 10/17/2024   LDLCALC 138 (H) 10/17/2024   ALT 7 10/17/2024   AST 15 10/17/2024   NA 138 10/17/2024   K 4.4 10/17/2024   CL 103 10/17/2024   CREATININE 0.78 10/17/2024   BUN 12 10/17/2024   CO2 22 10/17/2024   TSH 4.010 10/17/2024   HGBA1C 5.5 10/17/2024    MM 3D SCREENING MAMMOGRAM BILATERAL BREAST Result Date: 01/02/2024 CLINICAL DATA:  Screening. EXAM: DIGITAL SCREENING BILATERAL MAMMOGRAM WITH TOMOSYNTHESIS AND CAD TECHNIQUE: Bilateral screening digital craniocaudal and mediolateral oblique mammograms were obtained. Bilateral screening digital breast tomosynthesis was performed. The images were evaluated with computer-aided detection. COMPARISON:  Previous exam(s). ACR Breast Density Category b: There are scattered areas of fibroglandular density. FINDINGS: There are no findings suspicious for malignancy. IMPRESSION: No mammographic evidence of malignancy. A result letter of this screening mammogram will be mailed directly to the patient. RECOMMENDATION:  Screening mammogram in one year. (Code:SM-B-01Y) BI-RADS CATEGORY  1: Negative. Electronically Signed   By: Delon Music M.D.   On: 01/02/2024 08:07       Assessment & Plan:  Essential hypertension Assessment & Plan: Had intolerance to amlodipine . Tolerating diovan . Now on 160mg  q day. Blood pressure remaining above goal. Increase diovan  to 320mg  q day. Follow pressures. Follow metabolic panel.   Orders: -     Basic metabolic panel with GFR  Hypercholesteremia Assessment & Plan: The 10-year ASCVD risk score (Arnett DK, et al., 2019) is: 12.2%   Values used to calculate the score:     Age: 31 years     Clinically relevant sex: Female     Is Non-Hispanic African American: Yes     Diabetic: No     Tobacco smoker: No     Systolic Blood Pressure: 146 mmHg     Is BP treated: Yes     HDL Cholesterol: 58 mg/dL     Total Cholesterol: 210 mg/dL  Have previously discussed calculated cholesterol risk and cholesterol medication.  Has wanted to hold on restarting cholesterol medication. Follow lipid panel.   Orders: -     Hepatic function panel -     Lipid panel -     TSH -     CBC with Differential/Platelet  Hyperglycemia Assessment & Plan: Low carb diet and exercise. Follow met b and A1c.   Orders: -     Hemoglobin A1c  Vitamin D  deficiency Assessment & Plan: Check vitamin D  level with next labs.   Orders: -     VITAMIN D  25 Hydroxy (Vit-D Deficiency, Fractures)  Stress Assessment & Plan: Increased stress. Discussed. Considering possibly seeing a therapist.  Colon cancer screening Assessment & Plan: Colonoscopy 2015.  IFOB 02/2021 - negative. Follow up colonoscopy 22/2025 - internal hemorrhoids. Recommended f/u colonoscopy in 10 years.    Abnormal chest CT Assessment & Plan: Previous CT chest - no evidence of PE.  Small scattered nodules measuring less then 5mm.  Saw pulmonary.  Recommended f/u CT 2022 as outlined.  Have discussed f/u. She has wanted to hold on f/u  CT scan and wanted to hold on referral to pulmonary. Reports breathing is stable.    Other orders -     Valsartan ; Take 1 tablet (320 mg total) by mouth daily.  Dispense: 90 tablet; Refill: 1     Allena Hamilton, MD "

## 2024-10-17 NOTE — Assessment & Plan Note (Signed)
 Increased stress. Discussed. Considering possibly seeing a therapist.

## 2024-10-18 ENCOUNTER — Ambulatory Visit: Payer: Self-pay | Admitting: Internal Medicine

## 2024-10-18 DIAGNOSIS — E559 Vitamin D deficiency, unspecified: Secondary | ICD-10-CM

## 2024-10-18 DIAGNOSIS — Z8639 Personal history of other endocrine, nutritional and metabolic disease: Secondary | ICD-10-CM

## 2024-10-18 LAB — CBC WITH DIFFERENTIAL/PLATELET
Basophils Absolute: 0.1 x10E3/uL (ref 0.0–0.2)
Basos: 1 %
EOS (ABSOLUTE): 0.4 x10E3/uL (ref 0.0–0.4)
Eos: 5 %
Hematocrit: 36.9 % (ref 34.0–46.6)
Hemoglobin: 12 g/dL (ref 11.1–15.9)
Immature Grans (Abs): 0 x10E3/uL (ref 0.0–0.1)
Immature Granulocytes: 0 %
Lymphocytes Absolute: 3.1 x10E3/uL (ref 0.7–3.1)
Lymphs: 41 %
MCH: 30.3 pg (ref 26.6–33.0)
MCHC: 32.5 g/dL (ref 31.5–35.7)
MCV: 93 fL (ref 79–97)
Monocytes Absolute: 0.7 x10E3/uL (ref 0.1–0.9)
Monocytes: 9 %
Neutrophils Absolute: 3.3 x10E3/uL (ref 1.4–7.0)
Neutrophils: 44 %
Platelets: 201 x10E3/uL (ref 150–450)
RBC: 3.96 x10E6/uL (ref 3.77–5.28)
RDW: 12.3 % (ref 11.7–15.4)
WBC: 7.5 x10E3/uL (ref 3.4–10.8)

## 2024-10-18 LAB — BASIC METABOLIC PANEL WITH GFR
BUN/Creatinine Ratio: 15 (ref 12–28)
BUN: 12 mg/dL (ref 8–27)
CO2: 22 mmol/L (ref 20–29)
Calcium: 9.6 mg/dL (ref 8.7–10.3)
Chloride: 103 mmol/L (ref 96–106)
Creatinine, Ser: 0.78 mg/dL (ref 0.57–1.00)
Glucose: 95 mg/dL (ref 70–99)
Potassium: 4.4 mmol/L (ref 3.5–5.2)
Sodium: 138 mmol/L (ref 134–144)
eGFR: 85 mL/min/1.73

## 2024-10-18 LAB — HEPATIC FUNCTION PANEL
ALT: 7 IU/L (ref 0–32)
AST: 15 IU/L (ref 0–40)
Albumin: 4.4 g/dL (ref 3.9–4.9)
Alkaline Phosphatase: 62 IU/L (ref 49–135)
Bilirubin Total: 0.4 mg/dL (ref 0.0–1.2)
Bilirubin, Direct: 0.1 mg/dL (ref 0.00–0.40)
Total Protein: 7.6 g/dL (ref 6.0–8.5)

## 2024-10-18 LAB — TSH: TSH: 4.01 u[IU]/mL (ref 0.450–4.500)

## 2024-10-18 LAB — HEMOGLOBIN A1C
Est. average glucose Bld gHb Est-mCnc: 111 mg/dL
Hgb A1c MFr Bld: 5.5 % (ref 4.8–5.6)

## 2024-10-18 LAB — LIPID PANEL
Chol/HDL Ratio: 3.6 ratio (ref 0.0–4.4)
Cholesterol, Total: 210 mg/dL — ABNORMAL HIGH (ref 100–199)
HDL: 58 mg/dL
LDL Chol Calc (NIH): 138 mg/dL — ABNORMAL HIGH (ref 0–99)
Triglycerides: 80 mg/dL (ref 0–149)
VLDL Cholesterol Cal: 14 mg/dL (ref 5–40)

## 2024-10-18 LAB — VITAMIN D 25 HYDROXY (VIT D DEFICIENCY, FRACTURES): Vit D, 25-Hydroxy: 21.1 ng/mL — ABNORMAL LOW (ref 30.0–100.0)

## 2024-10-18 MED ORDER — VITAMIN D (ERGOCALCIFEROL) 1.25 MG (50000 UNIT) PO CAPS: 50000.0000 [IU] | ORAL_CAPSULE | ORAL | 1 refills | Status: AC

## 2024-10-27 ENCOUNTER — Encounter: Payer: Self-pay | Admitting: Internal Medicine

## 2024-10-27 NOTE — Assessment & Plan Note (Signed)
 Previous CT chest - no evidence of PE.  Small scattered nodules measuring less then 5mm.  Saw pulmonary.  Recommended f/u CT 2022 as outlined.  Have discussed f/u. She has wanted to hold on f/u CT scan and wanted to hold on referral to pulmonary. Reports breathing is stable.

## 2024-10-27 NOTE — Assessment & Plan Note (Signed)
 Low-carb diet and exercise.  Follow met b and A1c.

## 2024-10-27 NOTE — Assessment & Plan Note (Signed)
 The 10-year ASCVD risk score (Arnett DK, et al., 2019) is: 12.2%   Values used to calculate the score:     Age: 63 years     Clinically relevant sex: Female     Is Non-Hispanic African American: Yes     Diabetic: No     Tobacco smoker: No     Systolic Blood Pressure: 146 mmHg     Is BP treated: Yes     HDL Cholesterol: 58 mg/dL     Total Cholesterol: 210 mg/dL  Have previously discussed calculated cholesterol risk and cholesterol medication.  Has wanted to hold on restarting cholesterol medication. Follow lipid panel.

## 2024-10-27 NOTE — Assessment & Plan Note (Signed)
 Colonoscopy 2015.  IFOB 02/2021 - negative. Follow up colonoscopy 22/2025 - internal hemorrhoids. Recommended f/u colonoscopy in 10 years.

## 2024-10-27 NOTE — Assessment & Plan Note (Signed)
 Had intolerance to amlodipine . Tolerating diovan . Now on 160mg  q day. Blood pressure remaining above goal. Increase diovan  to 320mg  q day. Follow pressures. Follow metabolic panel.

## 2024-10-27 NOTE — Assessment & Plan Note (Signed)
 Check vitamin D level with next labs.  ?

## 2024-10-30 ENCOUNTER — Ambulatory Visit: Payer: Self-pay

## 2024-10-30 NOTE — Telephone Encounter (Signed)
 FYI Only or Action Required?: Action required by provider: request for appointment and requesting medication for cough.  Patient was last seen in primary care on 10/17/2024 by Glendia Shad, MD.  Called Nurse Triage reporting Cough.  Symptoms began a week ago.  Interventions attempted: OTC medications: cough drops, hard candy, warm liquids , reports has tried everything and Rest, hydration, or home remedies.  Symptoms are: unchanged.  Triage Disposition: Home Care  Patient/caregiver understands and will follow disposition?: No, wishes to speak with PCP   Recommended VV or UC VV. Call disconnected before VV could be made. Patient was reviewing her my chart options to schedule appt. Called patient back and no answer, LVMTCB            Reason for Disposition  Cough  Answer Assessment - Initial Assessment Questions No available appt with any provider until Jan 5. As patient requested. Requesting medication for cough x 1 week. No other sx reported. Happens every year at this time. Recommended UC VV to be able to get medication prescribed. Call disconnected. This RN attempted to contact patient for further assistance, no answer, LVMTCB.            1. ONSET: When did the cough begin?      1 week  2. SEVERITY: How bad is the cough today?      Moderate  3. SPUTUM: Describe the color of your sputum (e.g., none, dry cough; clear, white, yellow, green)     Dry cough  4. HEMOPTYSIS: Are you coughing up any blood? If Yes, ask: How much? (e.g., flecks, streaks, tablespoons, etc.)     na 5. DIFFICULTY BREATHING: Are you having difficulty breathing? If Yes, ask: How bad is it? (e.g., mild, moderate, severe)      None  6. FEVER: Do you have a fever? If Yes, ask: What is your temperature, how was it measured, and when did it start?     na 7. CARDIAC HISTORY: Do you have any history of heart disease? (e.g., heart attack, congestive heart failure)       na 8. LUNG HISTORY: Do you have any history of lung disease?  (e.g., pulmonary embolus, asthma, emphysema)     Na  9. PE RISK FACTORS: Do you have a history of blood clots? (or: recent major surgery, recent prolonged travel, bedridden)     na 10. OTHER SYMPTOMS: Do you have any other symptoms? (e.g., runny nose, wheezing, chest pain)       Dry cough  persistent, every year around this time. Has tried multiple treatments hard candy cough drops. Warm liquids, tea lemon . Has had tessalon in the past and not effective 11. PREGNANCY: Is there any chance you are pregnant? When was your last menstrual period?       na 12. TRAVEL: Have you traveled out of the country in the last month? (e.g., travel history, exposures)       na  Protocols used: Cough - Acute Non-Productive-A-AH

## 2024-10-30 NOTE — Telephone Encounter (Signed)
"  Mychart message sent  "

## 2024-10-30 NOTE — Telephone Encounter (Signed)
 Copied from CRM #[id number]. Topic: Clinical - Medication Question >> Oct 30, 2024 10:14 AM Ahlexyia S wrote: Reason for CRM: Pt is wanting to know if she can be prescribed something for coughing. Pt stated that she has been having an ongoing cough for about a week. Pt is requesting a callback.

## 2024-10-31 ENCOUNTER — Telehealth: Admitting: Physician Assistant

## 2024-10-31 DIAGNOSIS — J208 Acute bronchitis due to other specified organisms: Secondary | ICD-10-CM | POA: Diagnosis not present

## 2024-10-31 DIAGNOSIS — B9689 Other specified bacterial agents as the cause of diseases classified elsewhere: Secondary | ICD-10-CM

## 2024-10-31 MED ORDER — PROMETHAZINE-DM 6.25-15 MG/5ML PO SYRP: 5.0000 mL | ORAL_SOLUTION | Freq: Four times a day (QID) | ORAL | 0 refills | Status: AC | PRN

## 2024-10-31 MED ORDER — BENZONATATE 100 MG PO CAPS: 100.0000 mg | ORAL_CAPSULE | Freq: Three times a day (TID) | ORAL | 0 refills | Status: AC | PRN

## 2024-10-31 MED ORDER — AZITHROMYCIN 250 MG PO TABS: ORAL_TABLET | ORAL | 0 refills | Status: AC

## 2024-10-31 NOTE — Progress Notes (Signed)
 " Virtual Visit Consent   Kimberly Madden, you are scheduled for a virtual visit with a [facility] provider today. Just as with appointments in the office, your consent must be obtained to participate. Your consent will be active for this visit and any virtual visit you may have with one of our providers in the next 365 days. If you have a MyChart account, a copy of this consent can be sent to you electronically.  As this is a virtual visit, video technology does not allow for your provider to perform a traditional examination. This may limit your provider's ability to fully assess your condition. If your provider identifies any concerns that need to be evaluated in person or the need to arrange testing (such as labs, EKG, etc.), we will make arrangements to do so. Although advances in technology are sophisticated, we cannot ensure that it will always work on either your end or our end. If the connection with a video visit is poor, the visit may have to be switched to a telephone visit. With either a video or telephone visit, we are not always able to ensure that we have a secure connection.  By engaging in this virtual visit, you consent to the provision of healthcare and authorize for your insurance to be billed (if applicable) for the services provided during this visit. Depending on your insurance coverage, you may receive a charge related to this service.  I need to obtain your verbal consent now. Are you willing to proceed with your visit today? Kimberly Madden has provided verbal consent on 10/31/2024 for a virtual visit (video or telephone). Delon CHRISTELLA Dickinson, PA-C  Date: 10/31/2024 8:41 AM   Virtual Visit via Video Note   I, Delon CHRISTELLA Dickinson, connected with  Kimberly Madden  ([id number], 1961-10-17) on 10/31/2024 at  8:30 AM EST by a video-enabled telemedicine application and verified that I am speaking with the correct person using two identifiers.  Location: Patient: Virtual Visit Location  Patient: Home Provider: Virtual Visit Location Provider: Home Office   I discussed the limitations of evaluation and management by telemedicine and the availability of in person appointments. The patient expressed understanding and agreed to proceed.    History of Present Illness: Kimberly Madden is a 64 y.o. who identifies as a female who was assigned female at birth, and is being seen today for cough.  HPI: Cough This is a new problem. The current episode started 1 to 4 weeks ago (over one week; had traveled to [place]  for the football game over Christmas). The problem has been gradually worsening. The problem occurs every few minutes. The cough is Non-productive. Associated symptoms include a sore throat (now improved). Pertinent negatives include no chills, ear congestion, ear pain, fever, headaches, nasal congestion, postnasal drip or rhinorrhea. The symptoms are aggravated by lying down and cold air. Treatments tried: Mucinex, Halls cough drops, mucinex cough drops, Nyquil, hot tea with honey. The treatment provided no relief. There is no history of asthma or bronchitis.     Problems:  Patient Active Problem List   Diagnosis Date Noted   Hyperglycemia 02/10/2023   Hypercholesteremia 12/30/2021   Shortness of breath 12/30/2021   Snoring 10/25/2021   Vaginal irritation 09/05/2021   Rash 09/05/2021   Colon cancer screening 02/08/2021   Vitamin D  deficiency 02/08/2021   Pulmonary nodules 01/17/2020   Abnormal chest CT 12/19/2019   Stress 11/16/2019   Encounter for completion of form with patient 11/16/2019   Essential hypertension 06/26/2016  Hepatitis C without hepatic coma 06/15/2015   Healthcare maintenance 06/15/2015   History of vitamin D  deficiency 08/26/2013    Allergies: Allergies[1] Medications: Current Medications[2]  Observations/Objective: Patient is well-developed, well-nourished in no acute distress.  Resting comfortably at home.  Head is normocephalic,  atraumatic.  No labored breathing.  Speech is clear and coherent with logical content.  Patient is alert and oriented at baseline.    Assessment and Plan: 1. Acute bacterial bronchitis (Primary) - azithromycin (ZITHROMAX) 250 MG tablet; Take 2 tablets on day 1, then 1 tablet daily on days 2 through 5  Dispense: 6 tablet; Refill: 0 - promethazine-dextromethorphan (PROMETHAZINE-DM) 6.25-15 MG/5ML syrup; Take 5 mLs by mouth 4 (four) times daily as needed.  Dispense: 118 mL; Refill: 0 - benzonatate (TESSALON) 100 MG capsule; Take 1-2 capsules (100-200 mg total) by mouth 3 (three) times daily as needed.  Dispense: 30 capsule; Refill: 0  - Worsening over a week despite OTC medications - Will treat with Z-pack, Promethazine DM, and tessalon perles - Can continue Mucinex (PLAIN) during the daytime - Push fluids.  - Rest.  - Steam and humidifier can help - Seek in person evaluation if worsening or symptoms fail to improve    Follow Up Instructions: I discussed the assessment and treatment plan with the patient. The patient was provided an opportunity to ask questions and all were answered. The patient agreed with the plan and demonstrated an understanding of the instructions.  A copy of instructions were sent to the patient via MyChart unless otherwise noted below.    The patient was advised to call back or seek an in-person evaluation if the symptoms worsen or if the condition fails to improve as anticipated.    Delon HERO Shanea Karney, PA-C     [1] No Known Allergies [2]  Current Outpatient Medications:    azithromycin (ZITHROMAX) 250 MG tablet, Take 2 tablets on day 1, then 1 tablet daily on days 2 through 5, Disp: 6 tablet, Rfl: 0   benzonatate (TESSALON) 100 MG capsule, Take 1-2 capsules (100-200 mg total) by mouth 3 (three) times daily as needed., Disp: 30 capsule, Rfl: 0   promethazine-dextromethorphan (PROMETHAZINE-DM) 6.25-15 MG/5ML syrup, Take 5 mLs by mouth 4 (four) times daily as  needed., Disp: 118 mL, Rfl: 0   Acetaminophen (TYLENOL PO), Take by mouth as needed., Disp: , Rfl:    valsartan  (DIOVAN ) 320 MG tablet, Take 1 tablet (320 mg total) by mouth daily., Disp: 90 tablet, Rfl: 1   Vitamin D , Ergocalciferol , (DRISDOL ) 1.25 MG (50000 UNIT) CAPS capsule, Take 1 capsule (50,000 Units total) by mouth every 7 (seven) days., Disp: 12 capsule, Rfl: 1  "

## 2024-10-31 NOTE — Patient Instructions (Signed)
 " Kimberly Madden, thank you for joining Kimberly CHRISTELLA Dickinson, PA-C for today's virtual visit.  While this provider is not your primary care provider (PCP), if your PCP is located in our provider database this encounter information will be shared with them immediately following your visit.   A [facility] MyChart account gives you access to today's visit and all your visits, tests, and labs performed at [facility]  click here if you don't have a [facility] MyChart account or go to [email or web address]  Consent: (Patient) Kimberly Madden provided verbal consent for this virtual visit at the beginning of the encounter.  Current Medications:  Current Outpatient Medications:    azithromycin (ZITHROMAX) 250 MG tablet, Take 2 tablets on day 1, then 1 tablet daily on days 2 through 5, Disp: 6 tablet, Rfl: 0   benzonatate (TESSALON) 100 MG capsule, Take 1-2 capsules (100-200 mg total) by mouth 3 (three) times daily as needed., Disp: 30 capsule, Rfl: 0   promethazine-dextromethorphan (PROMETHAZINE-DM) 6.25-15 MG/5ML syrup, Take 5 mLs by mouth 4 (four) times daily as needed., Disp: 118 mL, Rfl: 0   Acetaminophen (TYLENOL PO), Take by mouth as needed., Disp: , Rfl:    valsartan  (DIOVAN ) 320 MG tablet, Take 1 tablet (320 mg total) by mouth daily., Disp: 90 tablet, Rfl: 1   Vitamin D , Ergocalciferol , (DRISDOL ) 1.25 MG (50000 UNIT) CAPS capsule, Take 1 capsule (50,000 Units total) by mouth every 7 (seven) days., Disp: 12 capsule, Rfl: 1   Medications ordered in this encounter:  Meds ordered this encounter  Medications   azithromycin (ZITHROMAX) 250 MG tablet    Sig: Take 2 tablets on day 1, then 1 tablet daily on days 2 through 5    Dispense:  6 tablet    Refill:  0    Supervising Provider:   LAMPTEY, PHILIP O [[id number]]   promethazine-dextromethorphan (PROMETHAZINE-DM) 6.25-15 MG/5ML syrup    Sig: Take 5 mLs by mouth 4 (four) times daily as needed.    Dispense:  118 mL    Refill:  0     Supervising Provider:   BLAISE ALEENE KIDD [[id number]]   benzonatate (TESSALON) 100 MG capsule    Sig: Take 1-2 capsules (100-200 mg total) by mouth 3 (three) times daily as needed.    Dispense:  30 capsule    Refill:  0    Supervising Provider:   BLAISE ALEENE KIDD [[id number]]     *If you need refills on other medications prior to your next appointment, please contact your pharmacy*  Follow-Up: Call back or seek an in-person evaluation if the symptoms worsen or if the condition fails to improve as anticipated.  [facility] [phone]  Other Instructions Acute Bronchitis, Adult  Acute bronchitis is sudden inflammation of the main airways (bronchi) that come off the windpipe (trachea) in the lungs. The swelling causes the airways to get smaller and make more mucus than normal. This can make it hard to breathe and can cause coughing or noisy breathing (wheezing). Acute bronchitis may last several weeks. The cough may last longer. Allergies, asthma, and exposure to smoke may make the condition worse. What are the causes? This condition can be caused by germs and by substances that irritate the lungs, including: Cold and flu viruses. The most common cause of this condition is the virus that causes the common cold. Bacteria. This is less common. Breathing in substances that irritate the lungs, including: Smoke from cigarettes and other forms of tobacco. Dust and  pollen. Fumes from household cleaning products, gases, or burned fuel. Indoor or outdoor air pollution. What increases the risk? The following factors may make you more likely to develop this condition: A weak body's defense system, also called the immune system. A condition that affects your lungs and breathing, such as asthma. What are the signs or symptoms? Common symptoms of this condition include: Coughing. This may bring up clear, yellow, or green mucus from your lungs (sputum). Wheezing. Runny or stuffy  nose. Having too much mucus in your lungs (chest congestion). Shortness of breath. Aches and pains, including sore throat or chest. How is this diagnosed? This condition is usually diagnosed based on: Your symptoms and medical history. A physical exam. You may also have other tests, including tests to rule out other conditions, such as pneumonia. These tests include: A test of lung function. Test of a mucus sample to look for the presence of bacteria. Tests to check the oxygen level in your blood. Blood tests. Chest X-ray. How is this treated? Most cases of acute bronchitis clear up over time without treatment. Your health care provider may recommend: Drinking more fluids to help thin your mucus so it is easier to cough up. Taking inhaled medicine (inhaler) to improve air flow in and out of your lungs. Using a vaporizer or a humidifier. These are machines that add water to the air to help you breathe better. Taking a medicine that thins mucus and clears congestion (expectorant). Taking a medicine that prevents or stops coughing (cough suppressant). It is not common to take an antibiotic medicine for this condition. Follow these instructions at home:  Take over-the-counter and prescription medicines only as told by your health care provider. Use an inhaler, vaporizer, or humidifier as told by your health care provider. Take two teaspoons (10 mL) of honey at bedtime to lessen coughing at night. Drink enough fluid to keep your urine pale yellow. Do not use any products that contain nicotine or tobacco. These products include cigarettes, chewing tobacco, and vaping devices, such as e-cigarettes. If you need help quitting, ask your health care provider. Get plenty of rest. Return to your normal activities as told by your health care provider. Ask your health care provider what activities are safe for you. Keep all follow-up visits. This is important. How is this prevented? To lower your  risk of getting this condition again: Wash your hands often with soap and water for at least 20 seconds. If soap and water are not available, use hand sanitizer. Avoid contact with people who have cold symptoms. Try not to touch your mouth, nose, or eyes with your hands. Avoid breathing in smoke or chemical fumes. Breathing smoke or chemical fumes will make your condition worse. Get the flu shot every year. Contact a health care provider if: Your symptoms do not improve after 2 weeks. You have trouble coughing up the mucus. Your cough keeps you awake at night. You have a fever. Get help right away if you: Cough up blood. Feel pain in your chest. Have severe shortness of breath. Faint or keep feeling like you are going to faint. Have a severe headache. Have a fever or chills that get worse. These symptoms may represent a serious problem that is an emergency. Do not wait to see if the symptoms will go away. Get medical help right away. Call your local emergency services (911 in the [place] Do not drive yourself to the hospital. Summary Acute bronchitis is inflammation of the main airways (  bronchi) that come off the windpipe (trachea) in the lungs. The swelling causes the airways to get smaller and make more mucus than normal. Drinking more fluids can help thin your mucus so it is easier to cough up. Take over-the-counter and prescription medicines only as told by your health care provider. Do not use any products that contain nicotine or tobacco. These products include cigarettes, chewing tobacco, and vaping devices, such as e-cigarettes. If you need help quitting, ask your health care provider. Contact a health care provider if your symptoms do not improve after 2 weeks. This information is not intended to replace advice given to you by your health care provider. Make sure you discuss any questions you have with your health care provider. Document Revised: 01/27/2022 Document Reviewed:  02/17/2021 Elsevier Patient Education  2024 [employer]   If you have been instructed to have an in-person evaluation today at a local Urgent Care facility, please use the link below. It will take you to a list of all of our available [facility], including address, phone number and hours of operation. Please do not delay care.  [facility]  If you or a family member do not have a primary care provider, use the link below to schedule a visit and establish care. When you choose a [facility] primary care physician or advanced practice provider, you gain a long-term partner in health. Find a Primary Care Provider  Learn more about [facility] in-office and virtual care options:  - Get Care Now "

## 2024-11-01 NOTE — Telephone Encounter (Signed)
 Reviewed. Evaluated and treated 10/31/24.

## 2024-12-12 ENCOUNTER — Ambulatory Visit: Admitting: Internal Medicine
